# Patient Record
Sex: Male | Born: 1955 | Race: White | Hispanic: No | Marital: Married | Smoking: Never smoker
Health system: Southern US, Community
[De-identification: ages and names within clinical notes are randomized; demographics above are authoritative.]

## PROBLEM LIST (undated history)

## (undated) DIAGNOSIS — I10 Essential (primary) hypertension: Secondary | ICD-10-CM

## (undated) DIAGNOSIS — M1990 Unspecified osteoarthritis, unspecified site: Secondary | ICD-10-CM

## (undated) DIAGNOSIS — N400 Enlarged prostate without lower urinary tract symptoms: Secondary | ICD-10-CM

## (undated) DIAGNOSIS — F329 Major depressive disorder, single episode, unspecified: Secondary | ICD-10-CM

## (undated) DIAGNOSIS — K219 Gastro-esophageal reflux disease without esophagitis: Secondary | ICD-10-CM

## (undated) DIAGNOSIS — F32A Depression, unspecified: Secondary | ICD-10-CM

## (undated) DIAGNOSIS — E7800 Pure hypercholesterolemia, unspecified: Secondary | ICD-10-CM

## (undated) DIAGNOSIS — R7303 Prediabetes: Secondary | ICD-10-CM

## (undated) DIAGNOSIS — E785 Hyperlipidemia, unspecified: Secondary | ICD-10-CM

## (undated) DIAGNOSIS — I700 Atherosclerosis of aorta: Secondary | ICD-10-CM

## (undated) HISTORY — PX: APPENDECTOMY: SHX54

## (undated) HISTORY — PX: COLONOSCOPY: SHX174

## (undated) HISTORY — PX: TONSILLECTOMY: SUR1361

## (undated) HISTORY — PX: ESOPHAGOGASTRODUODENOSCOPY: SHX1529

## (undated) HISTORY — PX: KNEE ARTHROSCOPY: SHX127

---

## 2005-02-07 ENCOUNTER — Ambulatory Visit: Payer: Self-pay | Admitting: Internal Medicine

## 2005-05-15 ENCOUNTER — Other Ambulatory Visit: Payer: Self-pay

## 2005-05-22 ENCOUNTER — Ambulatory Visit: Payer: Self-pay | Admitting: Urology

## 2006-02-07 ENCOUNTER — Ambulatory Visit: Payer: Self-pay | Admitting: Gastroenterology

## 2009-07-06 ENCOUNTER — Ambulatory Visit: Payer: Self-pay | Admitting: Gastroenterology

## 2013-01-12 ENCOUNTER — Ambulatory Visit: Payer: Self-pay | Admitting: Gastroenterology

## 2013-01-13 LAB — PATHOLOGY REPORT

## 2013-01-15 ENCOUNTER — Ambulatory Visit: Payer: Self-pay | Admitting: Gastroenterology

## 2013-02-04 ENCOUNTER — Ambulatory Visit: Payer: Self-pay | Admitting: Surgery

## 2013-02-12 ENCOUNTER — Ambulatory Visit: Payer: Self-pay | Admitting: Surgery

## 2013-02-12 DIAGNOSIS — I499 Cardiac arrhythmia, unspecified: Secondary | ICD-10-CM

## 2013-02-17 LAB — PATHOLOGY REPORT

## 2013-05-04 ENCOUNTER — Telehealth: Payer: Self-pay | Admitting: Cardiology

## 2013-05-04 NOTE — Telephone Encounter (Signed)
Error - please disregard prior note - this was wrong patient.  This patient did not have a heart monitor done

## 2013-05-04 NOTE — Telephone Encounter (Signed)
Please let patient know that heart monitor showed normal rhythm with a few extra heart beats from the top of his heart which are benign

## 2014-08-19 NOTE — Op Note (Signed)
PATIENT NAME:  Nathaniel Green, Nathaniel Green MR#:  [id number] DATE OF BIRTH:  10-03-1955  DATE OF PROCEDURE:  02/12/2013  PREOPERATIVE DIAGNOSIS:  Appendiceal mass.   POSTOPERATIVE DIAGNOSIS:  Appendiceal mass.   PROCEDURE:  Laparoscopic appendectomy with cecectomy.   SURGEON:  Loreli Dollar, MD  ANESTHESIA:  General.   INDICATIONS:  This 59 year old male recently had a colonoscopy with findings of an appearance of a mass at the appendiceal orifice. CT scan appeared to demonstrate a small mass at this site. Surgery was recommended for further evaluation and treatment.   DESCRIPTION OF PROCEDURE:  The patient was placed on the operating table in the supine position under general anesthesia. The abdomen was clipped and prepared with ChloraPrep and draped in a sterile manner.   A short incision was made in the inferior aspect of the umbilicus and carried down to the deep fascia, which was grasped with laryngeal hook and elevated. A Veress needle was inserted, aspirated, and irrigated with a saline solution. Next, the peritoneal cavity was inflated with carbon dioxide. The Veress needle was removed. The 10-mm cannula was inserted. The 10-mm, 0-degree laparoscope was inserted to view the peritoneal cavity. There were no gross abnormalities seen with initial inspection of the abdomen. Another incision was made in the left lower quadrant lateral to the inferior epigastric vessels to insert a 12-mm cannula. A Babcock clamp was introduced and grasped the cecum and lifted it up for a brief inspection. The patient was placed in  Trendelenburg position and turned towards the left. Another incision was made in the right lower quadrant lateral to the inferior epigastric vessels to insert a 5-mm cannula.   Next, the Babcock clamp was used to elevate the cecum and retract it. There was a finding of some adhesions between the ileum and the pelvic sidewall. These were taken down with sharp dissection. Mobilizing the terminal  ileum and the cecum and then identified the appendix. The appendix was mobilized with blunt and sharp dissection. Its junction with the cecum was demonstrated. The junction of the terminal ileum to the cecum was demonstrated. There was fat pad, which was dissected and mobilized with the scissors and cautery. Next a window was created in the mesoappendix and the mesoappendix was divided with application of the white cartridge Endo GIA stapler and there was just a thread of remainder of the mesoappendix, which was divided with scissors and cautery. Next, some fatty tissues were dissected away from the cecum adjacent to the base of the appendix. Subsequently, using the blue cartridge Endo GIA stapler, this was advanced across a wedge of the cecum and used 4 loads of the blue cartridge Endo GIA stapler to advance across the portion of the cecum, which was resected. The staple line appeared to be hemostatic. The appendix and wedge of cecum was removed through the 12-mm port site, and also there was a separate adjacent portion of fatty tissue that was removed as well.   On a side table, I did open the cecum by excising a portion of the staple line and identified the appendiceal orifice. I did not see any tumor formation, and it appeared that there was approximately 1.5 to 2 cm of cecum surrounding the appendiceal orifice, and this was submitted in formalin for routine pathology.   Next, the operative site was further inspected. A small amount of blood was aspirated. The site was irrigated with heparinized saline solution and aspirated multiple times. Hemostasis was intact. The 12-mm incision was inspected. Hemostasis was  intact. Next, the 5-mm port was removed, and then the infraumbilical port removed. Carbon dioxide was allowed to escape from the peritoneal cavity. The skin incisions were closed with interrupted 5-0 chromic subcuticular sutures and Dermabond. The patient tolerated surgery satisfactorily and was then  prepared for transfer to the Recovery Room.  ____________________________ Lenna Sciara. Rochel Brome, MD [email or web address] D: 02/12/2013 10:53:31 ET T: 02/12/2013 11:08:29 ET JOB#: [id number]  cc: Loreli Dollar, MD, <Dictator> Loreli Dollar MD ELECTRONICALLY SIGNED 02/12/2013 12:46

## 2015-03-10 IMAGING — CT CT ABD-PELV W/ CM
1 of 3 series · 13 of 32 positions shown, 18 images · IV contrast (isovue)
Comparison: None

REASON FOR EXAM: abnormal appearing appendix seen on colonscopy
COMMENTS:

PROCEDURE:     KCT - KCT ABDOMEN/PELVIS W  - January 15, 2013 [DATE]
RESULT:     History: Abnormal appendix on colonoscopy.
TECHNIQUE: Multiple axial images of the abdomen and pelvis were performed
from the lung bases to the pubic symphysis, with p.o. contrast and with 100
ml of Isovue 370 intravenous contrast.

[Series 2: abd 3mm w 3.0 i40f 3 · axial · 0.77mm/px · z∈[-1143,-732]mm · 13 of 157 slices shown, 18 images]
[im 10/157  soft-tissue]
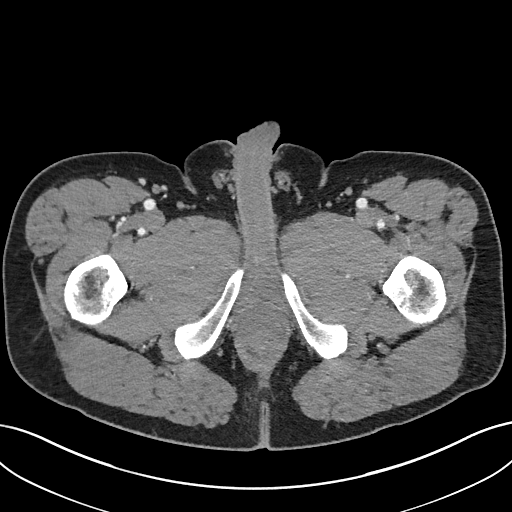
[im 10/157  bone]
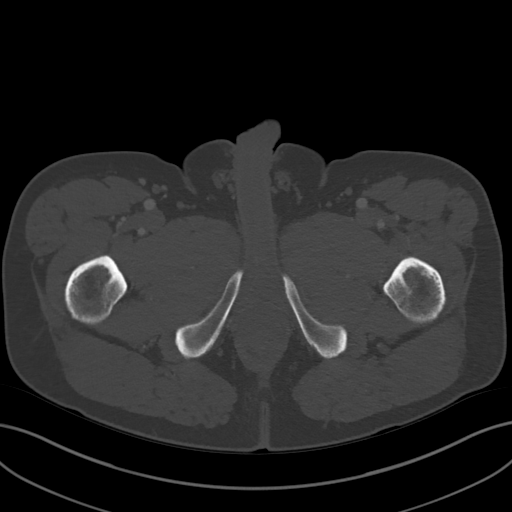
[im 28/157  soft-tissue]
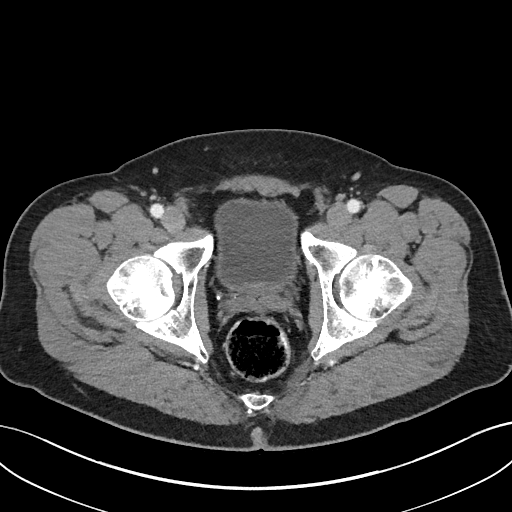
[im 37/157  soft-tissue]
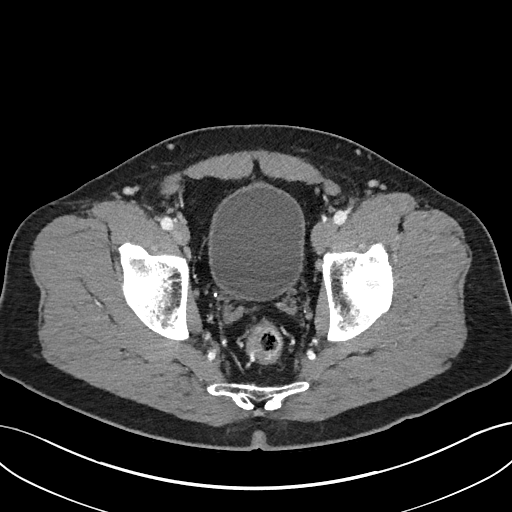
[im 46/157  soft-tissue]
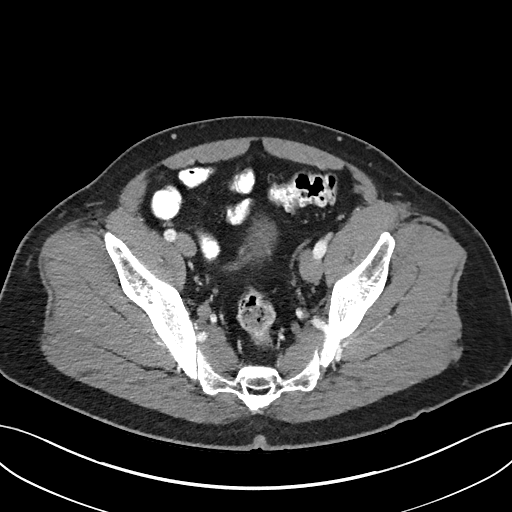
[im 65/157  soft-tissue]
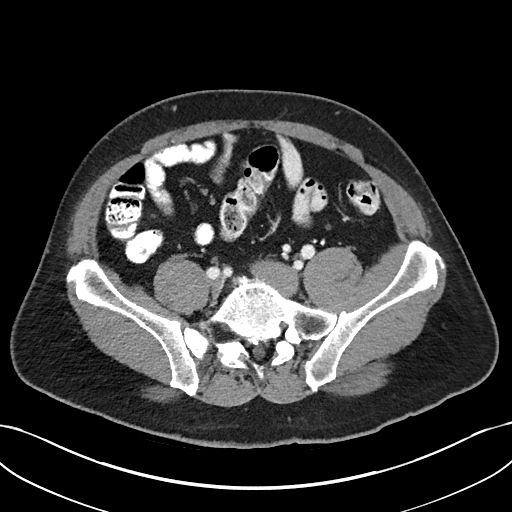
[im 74/157  soft-tissue]
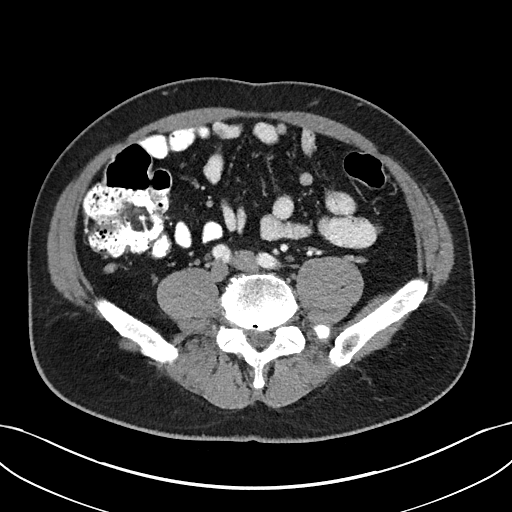
[im 83/157  soft-tissue]
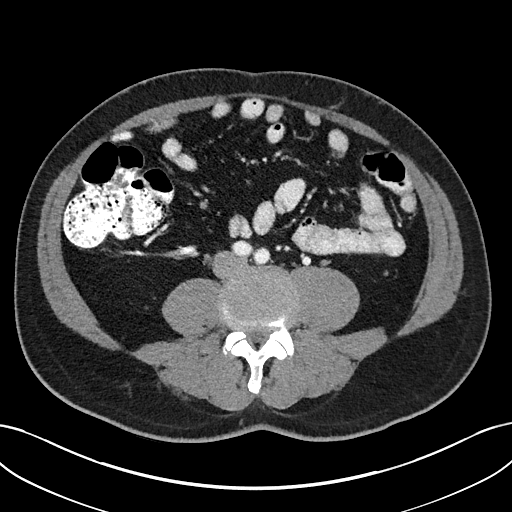
[im 101/157  soft-tissue]
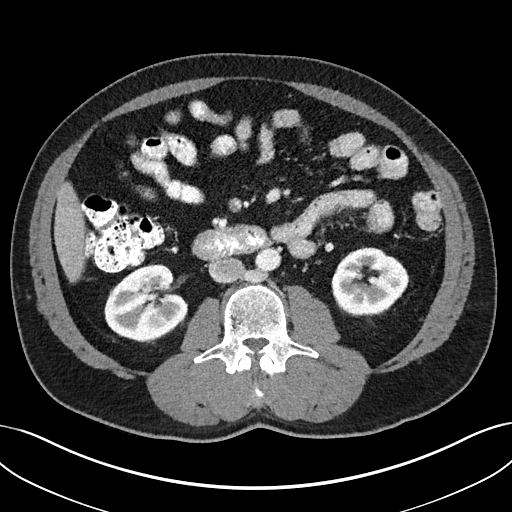
[im 111/157  soft-tissue]
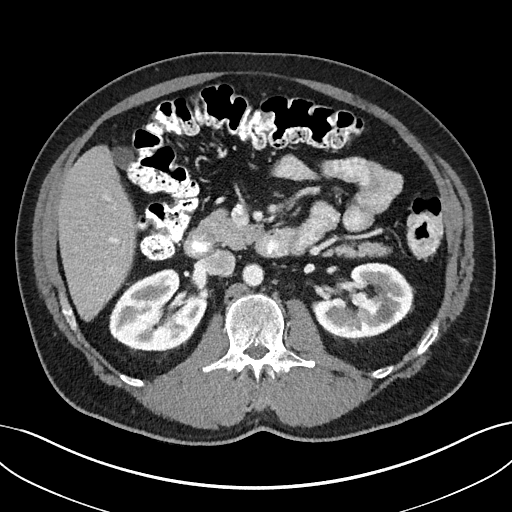
[im 111/157  bone]
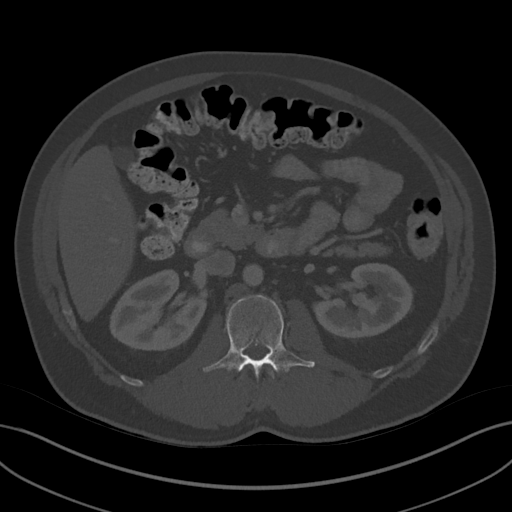
[im 120/157  soft-tissue]
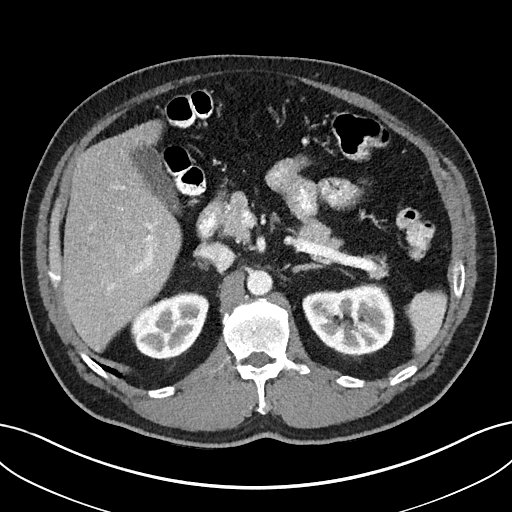
[im 120/157  lung]
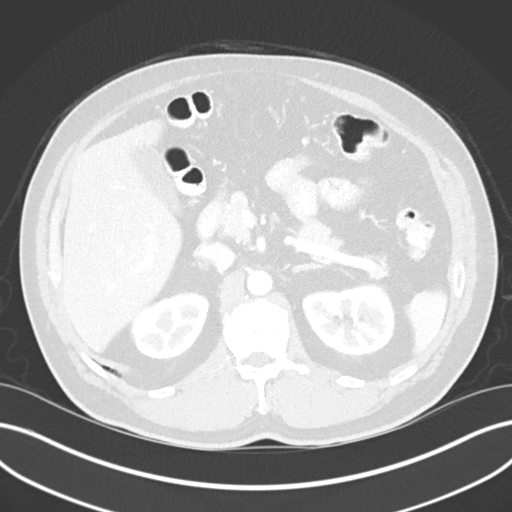
[im 129/157  lung]
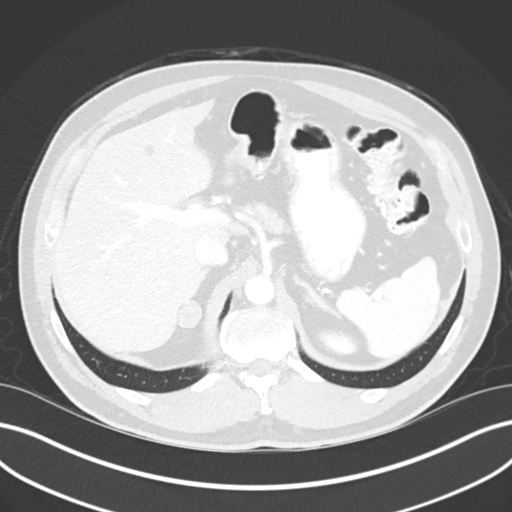
[im 138/157  soft-tissue]
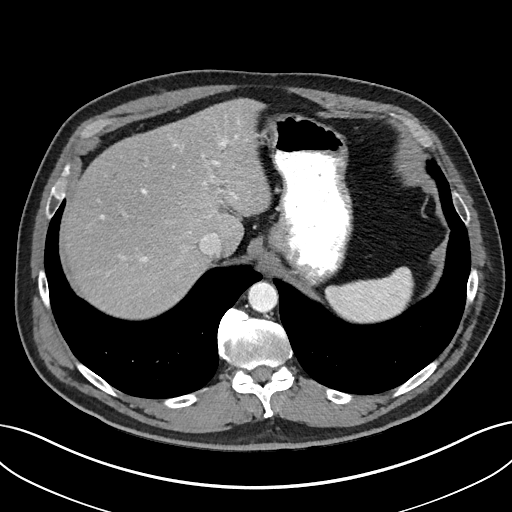
[im 138/157  lung]
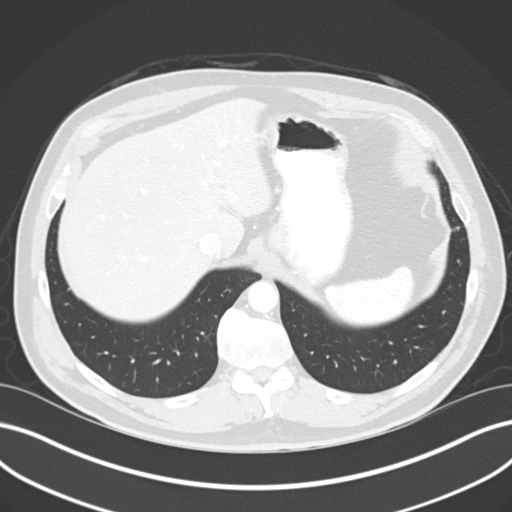
[im 147/157  soft-tissue]
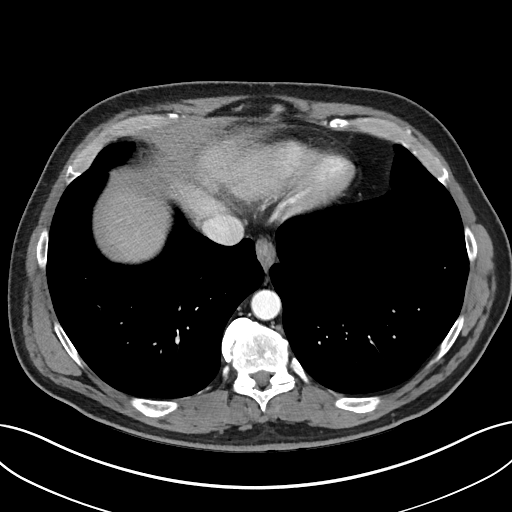
[im 147/157  lung]
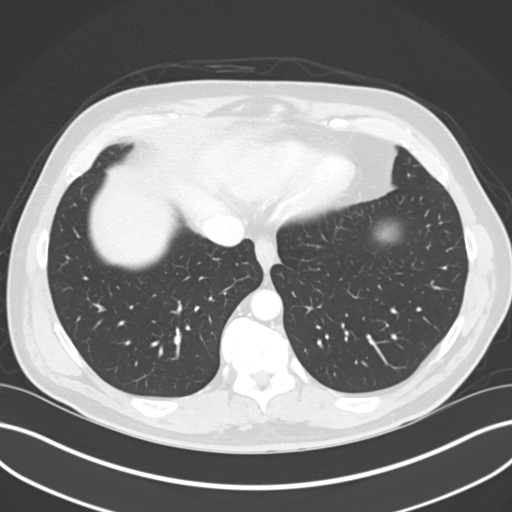

[13 of 32 positions shown; findings below may reference images not displayed]

FINDINGS: The lung bases are clear. There is no pneumothorax. The heart size is
normal.

There are a few subcentimeter hypodensities scattered within the liver
likely representing small cysts or hamartomas. There is no intrahepatic or
extrahepatic biliary ductal dilatation. The gallbladder is unremarkable. The
spleen demonstrates no focal abnormality. There is an indeterminate 2.4 cm
right adrenal mass. There is a hypodense, fluid attenuating 2.5 cm left
renal mass most consistent with a cyst. The right kidney, left adrenal gland
and pancreas are normal. The bladder is unremarkable.

The stomach, duodenum, small intestine, and large intestine demonstrate no
contrast extravasation or dilatation. The appendix is normal in caliber
without periappendiceal inflammatory changes. There is soft tissue
prominence at the base of the appendix within the cecum measuring
approximately 1.4 cm which may represent fecal material versus a possible
small soft tissue mass. There is no pneumoperitoneum, pneumatosis, or portal
venous gas. There is no abdominal or pelvic free fluid. There is no
lymphadenopathy.

The abdominal aorta is normal in caliber.

The osseous structures are unremarkable.
IMPRESSION: 1. There is soft tissue prominence at the base of the appendix within the
cecum measuring approximately 1.4 cm which may represent fecal material
versus a possible small soft tissue mass. Recommend correlation with recent
colonoscopy and any possible biopsy performed at that time.

2. There is an indeterminate 2.4 cm right adrenal mass. Recommend followup
evaluation with a MRI of the abdomen without and with intravenous contrast
for further characterization.

[REDACTED]

## 2015-05-31 DIAGNOSIS — L578 Other skin changes due to chronic exposure to nonionizing radiation: Secondary | ICD-10-CM | POA: Diagnosis not present

## 2015-05-31 DIAGNOSIS — B351 Tinea unguium: Secondary | ICD-10-CM | POA: Diagnosis not present

## 2015-05-31 DIAGNOSIS — D229 Melanocytic nevi, unspecified: Secondary | ICD-10-CM | POA: Diagnosis not present

## 2015-05-31 DIAGNOSIS — B353 Tinea pedis: Secondary | ICD-10-CM | POA: Diagnosis not present

## 2015-05-31 DIAGNOSIS — Z1283 Encounter for screening for malignant neoplasm of skin: Secondary | ICD-10-CM | POA: Diagnosis not present

## 2015-05-31 DIAGNOSIS — L812 Freckles: Secondary | ICD-10-CM | POA: Diagnosis not present

## 2015-05-31 DIAGNOSIS — D1800 Hemangioma unspecified site: Secondary | ICD-10-CM | POA: Diagnosis not present

## 2015-05-31 DIAGNOSIS — L821 Other seborrheic keratosis: Secondary | ICD-10-CM | POA: Diagnosis not present

## 2015-05-31 DIAGNOSIS — L739 Follicular disorder, unspecified: Secondary | ICD-10-CM | POA: Diagnosis not present

## 2015-11-08 DIAGNOSIS — H524 Presbyopia: Secondary | ICD-10-CM | POA: Diagnosis not present

## 2015-12-13 DIAGNOSIS — R6882 Decreased libido: Secondary | ICD-10-CM | POA: Diagnosis not present

## 2015-12-13 DIAGNOSIS — Z125 Encounter for screening for malignant neoplasm of prostate: Secondary | ICD-10-CM | POA: Diagnosis not present

## 2015-12-13 DIAGNOSIS — Z114 Encounter for screening for human immunodeficiency virus [HIV]: Secondary | ICD-10-CM | POA: Diagnosis not present

## 2015-12-13 DIAGNOSIS — Z0000 Encounter for general adult medical examination without abnormal findings: Secondary | ICD-10-CM | POA: Diagnosis not present

## 2015-12-13 DIAGNOSIS — I10 Essential (primary) hypertension: Secondary | ICD-10-CM | POA: Diagnosis not present

## 2015-12-13 DIAGNOSIS — M1710 Unilateral primary osteoarthritis, unspecified knee: Secondary | ICD-10-CM | POA: Diagnosis not present

## 2015-12-20 DIAGNOSIS — M1710 Unilateral primary osteoarthritis, unspecified knee: Secondary | ICD-10-CM | POA: Diagnosis not present

## 2015-12-20 DIAGNOSIS — E784 Other hyperlipidemia: Secondary | ICD-10-CM | POA: Diagnosis not present

## 2015-12-20 DIAGNOSIS — I10 Essential (primary) hypertension: Secondary | ICD-10-CM | POA: Diagnosis not present

## 2015-12-20 DIAGNOSIS — Z0001 Encounter for general adult medical examination with abnormal findings: Secondary | ICD-10-CM | POA: Diagnosis not present

## 2016-09-24 DIAGNOSIS — M25561 Pain in right knee: Secondary | ICD-10-CM | POA: Diagnosis not present

## 2016-09-24 DIAGNOSIS — M25562 Pain in left knee: Secondary | ICD-10-CM | POA: Diagnosis not present

## 2016-10-03 DIAGNOSIS — M25561 Pain in right knee: Secondary | ICD-10-CM | POA: Diagnosis not present

## 2016-10-04 DIAGNOSIS — M25561 Pain in right knee: Secondary | ICD-10-CM | POA: Diagnosis not present

## 2016-10-07 NOTE — H&P (Signed)
This is a pleasant 61 year old gentleman who presents to our clinic today with bilateral knee pain, right greater than left.  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  He is hanging in there and doing okay, but has had recent twisting event to the right knee while playing tennis.  Since then he has had sharp shooting pain and slight swelling in the medial aspect.  He has tried anti-inflammatories with no relief of symptoms.  He is not having mechanical symptoms.    Past Medical, Family and Social History reviewed in detail on patient questionnaire and signed.   Review of systems as detailed on HPI; all others reviewed and are negative.    EXAMINATION: Well-developed, well-nourished gentleman in no acute distress.  Alert and oriented x 3.  Examination of the left knee reveals valgus deformity.  Range of motion to 120 degrees.  Right knee range of motion is from 0 to 120 degrees.  Marked pain medial joint line over the medial meniscus.  He is neurovascularly intact distally.    X-RAYS: X-rays of the left knee reveal bone-on-bone in all three compartments.  Right knee with moderate tri-compartmental changes.    IMPRESSION: 1. 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  2. Right knee acute traumatic medial meniscus tear.    DISPOSITION: In regards to the left knee we are happy to inject this if he continues to have pain.  At the end of the day this will come down to a total knee replacement.  In regards to his right knee we are going to get an MRI to assess his medial meniscus.  Follow up with [place] once this is complete.    Addendum: MRI right knee shows a large medial meniscus tear.  Mahonri would like to proceed with right knee arthroscopic debridement of this meniscus tear.  Risks, benefits and possible complications reviewed.  Rehab and recovery time discussed.  All questions were answered.  Paperwork  complete.

## 2016-10-11 ENCOUNTER — Encounter (HOSPITAL_BASED_OUTPATIENT_CLINIC_OR_DEPARTMENT_OTHER): Payer: Self-pay | Admitting: *Deleted

## 2016-10-16 ENCOUNTER — Encounter (HOSPITAL_BASED_OUTPATIENT_CLINIC_OR_DEPARTMENT_OTHER)
Admission: RE | Admit: 2016-10-16 | Discharge: 2016-10-16 | Disposition: A | Discharge status: Home / Self Care | Payer: 59 | Source: Ambulatory Visit | Attending: Orthopedic Surgery | Admitting: Orthopedic Surgery

## 2016-10-16 DIAGNOSIS — Y929 Unspecified place or not applicable: Secondary | ICD-10-CM | POA: Diagnosis not present

## 2016-10-16 DIAGNOSIS — S83281A Other tear of lateral meniscus, current injury, right knee, initial encounter: Secondary | ICD-10-CM | POA: Diagnosis not present

## 2016-10-16 DIAGNOSIS — S83241A Other tear of medial meniscus, current injury, right knee, initial encounter: Secondary | ICD-10-CM | POA: Diagnosis not present

## 2016-10-16 DIAGNOSIS — M1711 Unilateral primary osteoarthritis, right knee: Secondary | ICD-10-CM | POA: Diagnosis not present

## 2016-10-16 DIAGNOSIS — I10 Essential (primary) hypertension: Secondary | ICD-10-CM | POA: Diagnosis not present

## 2016-10-16 DIAGNOSIS — Y9369 Activity, other involving other sports and athletics played as a team or group: Secondary | ICD-10-CM | POA: Diagnosis not present

## 2016-10-17 ENCOUNTER — Encounter (HOSPITAL_BASED_OUTPATIENT_CLINIC_OR_DEPARTMENT_OTHER): Payer: Self-pay | Admitting: Anesthesiology

## 2016-10-17 ENCOUNTER — Encounter (HOSPITAL_BASED_OUTPATIENT_CLINIC_OR_DEPARTMENT_OTHER)
Admission: RE | Disposition: A | Discharge status: Home / Self Care | Payer: Self-pay | Source: Ambulatory Visit | Attending: Orthopedic Surgery

## 2016-10-17 ENCOUNTER — Ambulatory Visit (HOSPITAL_BASED_OUTPATIENT_CLINIC_OR_DEPARTMENT_OTHER): Payer: 59 | Admitting: Anesthesiology

## 2016-10-17 ENCOUNTER — Ambulatory Visit (HOSPITAL_BASED_OUTPATIENT_CLINIC_OR_DEPARTMENT_OTHER)
Admission: RE | Admit: 2016-10-17 | Discharge: 2016-10-17 | Disposition: A | Discharge status: Home / Self Care | Payer: 59 | Source: Ambulatory Visit | Attending: Orthopedic Surgery | Admitting: Orthopedic Surgery

## 2016-10-17 DIAGNOSIS — S83241A Other tear of medial meniscus, current injury, right knee, initial encounter: Secondary | ICD-10-CM | POA: Insufficient documentation

## 2016-10-17 DIAGNOSIS — S60212A Contusion of left wrist, initial encounter: Secondary | ICD-10-CM | POA: Diagnosis not present

## 2016-10-17 DIAGNOSIS — I10 Essential (primary) hypertension: Secondary | ICD-10-CM | POA: Diagnosis not present

## 2016-10-17 DIAGNOSIS — S6992XA Unspecified injury of left wrist, hand and finger(s), initial encounter: Secondary | ICD-10-CM | POA: Diagnosis not present

## 2016-10-17 DIAGNOSIS — S83281A Other tear of lateral meniscus, current injury, right knee, initial encounter: Secondary | ICD-10-CM | POA: Diagnosis not present

## 2016-10-17 DIAGNOSIS — M1711 Unilateral primary osteoarthritis, right knee: Secondary | ICD-10-CM | POA: Diagnosis not present

## 2016-10-17 DIAGNOSIS — M2241 Chondromalacia patellae, right knee: Secondary | ICD-10-CM | POA: Diagnosis not present

## 2016-10-17 DIAGNOSIS — Y9369 Activity, other involving other sports and athletics played as a team or group: Secondary | ICD-10-CM | POA: Insufficient documentation

## 2016-10-17 DIAGNOSIS — Y929 Unspecified place or not applicable: Secondary | ICD-10-CM | POA: Insufficient documentation

## 2016-10-17 HISTORY — PX: KNEE ARTHROSCOPY WITH MEDIAL MENISECTOMY: SHX5651

## 2016-10-17 HISTORY — DX: Pure hypercholesterolemia, unspecified: E78.00

## 2016-10-17 HISTORY — PX: KNEE ARTHROSCOPY WITH LATERAL MENISECTOMY: SHX6193

## 2016-10-17 HISTORY — DX: Depression, unspecified: F32.A

## 2016-10-17 HISTORY — PX: CHONDROPLASTY: SHX5177

## 2016-10-17 HISTORY — DX: Essential (primary) hypertension: I10

## 2016-10-17 HISTORY — DX: Major depressive disorder, single episode, unspecified: F32.9

## 2016-10-17 SURGERY — ARTHROSCOPY, KNEE, WITH MEDIAL MENISCECTOMY
Anesthesia: General | Site: Knee | Laterality: Right

## 2016-10-17 MED ORDER — LABETALOL HCL 5 MG/ML IV SOLN
INTRAVENOUS | Status: DC | PRN
  Administered 2016-10-17: 5 mg via INTRAVENOUS

## 2016-10-17 MED ORDER — CHLORHEXIDINE GLUCONATE 4 % EX LIQD: 60.0000 mL | Freq: Once | CUTANEOUS | Status: DC

## 2016-10-17 MED ORDER — LIDOCAINE 2% (20 MG/ML) 5 ML SYRINGE
INTRAMUSCULAR | Status: AC
  Filled 2016-10-17: qty 5

## 2016-10-17 MED ORDER — FENTANYL CITRATE (PF) 100 MCG/2ML IJ SOLN
50.0000 ug | INTRAMUSCULAR | Status: AC | PRN
  Administered 2016-10-17 (×3): 50 ug via INTRAVENOUS

## 2016-10-17 MED ORDER — LACTATED RINGERS IV SOLN: INTRAVENOUS | Status: DC

## 2016-10-17 MED ORDER — CEFAZOLIN SODIUM-DEXTROSE 2-4 GM/100ML-% IV SOLN
2.0000 g | INTRAVENOUS | Status: AC
  Administered 2016-10-17: 2 g via INTRAVENOUS

## 2016-10-17 MED ORDER — KETOROLAC TROMETHAMINE 30 MG/ML IJ SOLN
INTRAMUSCULAR | Status: DC | PRN
  Administered 2016-10-17: 15 mg via INTRAVENOUS

## 2016-10-17 MED ORDER — MIDAZOLAM HCL 2 MG/2ML IJ SOLN
INTRAMUSCULAR | Status: AC
  Filled 2016-10-17: qty 2

## 2016-10-17 MED ORDER — HYDROCODONE-ACETAMINOPHEN 7.5-325 MG PO TABS: 1.0000 | ORAL_TABLET | Freq: Once | ORAL | Status: DC | PRN

## 2016-10-17 MED ORDER — LACTATED RINGERS IV SOLN
INTRAVENOUS | Status: DC
  Administered 2016-10-17: 11:00:00 via INTRAVENOUS

## 2016-10-17 MED ORDER — PROPOFOL 10 MG/ML IV BOLUS
INTRAVENOUS | Status: DC | PRN
  Administered 2016-10-17: 200 mg via INTRAVENOUS

## 2016-10-17 MED ORDER — DEXAMETHASONE SODIUM PHOSPHATE 10 MG/ML IJ SOLN
INTRAMUSCULAR | Status: AC
  Filled 2016-10-17: qty 1

## 2016-10-17 MED ORDER — FENTANYL CITRATE (PF) 100 MCG/2ML IJ SOLN
INTRAMUSCULAR | Status: AC
  Filled 2016-10-17: qty 2

## 2016-10-17 MED ORDER — ONDANSETRON HCL 4 MG/2ML IJ SOLN
INTRAMUSCULAR | Status: DC | PRN
  Administered 2016-10-17: 4 mg via INTRAVENOUS

## 2016-10-17 MED ORDER — SCOPOLAMINE 1 MG/3DAYS TD PT72: 1.0000 | MEDICATED_PATCH | Freq: Once | TRANSDERMAL | Status: DC | PRN

## 2016-10-17 MED ORDER — ONDANSETRON HCL 4 MG/2ML IJ SOLN
INTRAMUSCULAR | Status: AC
  Filled 2016-10-17: qty 2

## 2016-10-17 MED ORDER — DEXAMETHASONE SODIUM PHOSPHATE 4 MG/ML IJ SOLN
INTRAMUSCULAR | Status: DC | PRN
  Administered 2016-10-17: 10 mg via INTRAVENOUS

## 2016-10-17 MED ORDER — CEFAZOLIN SODIUM-DEXTROSE 2-4 GM/100ML-% IV SOLN
INTRAVENOUS | Status: AC
  Filled 2016-10-17: qty 100

## 2016-10-17 MED ORDER — LABETALOL HCL 5 MG/ML IV SOLN
INTRAVENOUS | Status: AC
  Filled 2016-10-17: qty 4

## 2016-10-17 MED ORDER — BUPIVACAINE HCL (PF) 0.5 % IJ SOLN
INTRAMUSCULAR | Status: DC | PRN
  Administered 2016-10-17: 20 mL

## 2016-10-17 MED ORDER — HYDROMORPHONE HCL 1 MG/ML IJ SOLN
0.2500 mg | INTRAMUSCULAR | Status: DC | PRN
  Administered 2016-10-17: 0.25 mg via INTRAVENOUS

## 2016-10-17 MED ORDER — KETOROLAC TROMETHAMINE 30 MG/ML IJ SOLN
INTRAMUSCULAR | Status: AC
  Filled 2016-10-17: qty 1

## 2016-10-17 MED ORDER — OXYCODONE-ACETAMINOPHEN 5-325 MG PO TABS: 1.0000 | ORAL_TABLET | ORAL | 0 refills | Status: AC | PRN

## 2016-10-17 MED ORDER — PROMETHAZINE HCL 25 MG/ML IJ SOLN: 6.2500 mg | INTRAMUSCULAR | Status: DC | PRN

## 2016-10-17 MED ORDER — ONDANSETRON HCL 4 MG PO TABS: 4.0000 mg | ORAL_TABLET | Freq: Three times a day (TID) | ORAL | 0 refills | Status: AC | PRN

## 2016-10-17 MED ORDER — METHYLPREDNISOLONE ACETATE 80 MG/ML IJ SUSP
INTRAMUSCULAR | Status: DC | PRN
  Administered 2016-10-17: 80 mg

## 2016-10-17 MED ORDER — METHYLPREDNISOLONE ACETATE 80 MG/ML IJ SUSP
INTRAMUSCULAR | Status: AC
  Filled 2016-10-17: qty 1

## 2016-10-17 MED ORDER — HYDROMORPHONE HCL 1 MG/ML IJ SOLN
INTRAMUSCULAR | Status: AC
  Filled 2016-10-17: qty 0.5

## 2016-10-17 MED ORDER — LIDOCAINE HCL (CARDIAC) 20 MG/ML IV SOLN
INTRAVENOUS | Status: DC | PRN
  Administered 2016-10-17: 60 mg via INTRAVENOUS

## 2016-10-17 MED ORDER — BUPIVACAINE HCL (PF) 0.5 % IJ SOLN
INTRAMUSCULAR | Status: AC
  Filled 2016-10-17: qty 30

## 2016-10-17 MED ORDER — SODIUM CHLORIDE 0.9 % IR SOLN
Status: DC | PRN
  Administered 2016-10-17: 6000 mL

## 2016-10-17 MED ORDER — MIDAZOLAM HCL 2 MG/2ML IJ SOLN
1.0000 mg | INTRAMUSCULAR | Status: DC | PRN
Start: 2016-10-17 — End: 2016-10-17
  Administered 2016-10-17: 2 mg via INTRAVENOUS

## 2016-10-17 SURGICAL SUPPLY — 41 items
BANDAGE ACE 6X5 VEL STRL LF (GAUZE/BANDAGES/DRESSINGS) ×4 IMPLANT
BLADE CUDA 5.5 (BLADE) IMPLANT
BLADE CUDA GRT WHITE 3.5 (BLADE) IMPLANT
BLADE CUTTER GATOR 3.5 (BLADE) ×4 IMPLANT
BLADE CUTTER MENIS 5.5 (BLADE) IMPLANT
BLADE GREAT WHITE 4.2 (BLADE) ×3 IMPLANT
BLADE GREAT WHITE 4.2MM (BLADE) ×1
BUR OVAL 4.0 (BURR) IMPLANT
CUTTER MENISCUS  4.2MM (BLADE)
CUTTER MENISCUS 4.2MM (BLADE) IMPLANT
DRAPE ARTHROSCOPY W/POUCH 90 (DRAPES) ×4 IMPLANT
DURAPREP 26ML APPLICATOR (WOUND CARE) ×4 IMPLANT
ELECT MENISCUS 165MM 90D (ELECTRODE) IMPLANT
ELECT REM PT RETURN 9FT ADLT (ELECTROSURGICAL)
ELECTRODE REM PT RTRN 9FT ADLT (ELECTROSURGICAL) IMPLANT
GAUZE SPONGE 4X4 12PLY STRL (GAUZE/BANDAGES/DRESSINGS) ×4 IMPLANT
GAUZE XEROFORM 1X8 LF (GAUZE/BANDAGES/DRESSINGS) ×4 IMPLANT
GLOVE BIO SURGEON STRL SZ7 (GLOVE) ×4 IMPLANT
GLOVE BIOGEL PI IND STRL 7.0 (GLOVE) ×2 IMPLANT
GLOVE BIOGEL PI IND STRL 7.5 (GLOVE) ×2 IMPLANT
GLOVE BIOGEL PI INDICATOR 7.0 (GLOVE) ×2
GLOVE BIOGEL PI INDICATOR 7.5 (GLOVE) ×2
GLOVE ECLIPSE 7.0 STRL STRAW (GLOVE) ×4 IMPLANT
GLOVE SURG ORTHO 8.0 STRL STRW (GLOVE) ×4 IMPLANT
GOWN STRL REUS W/ TWL LRG LVL3 (GOWN DISPOSABLE) ×2 IMPLANT
GOWN STRL REUS W/ TWL XL LVL3 (GOWN DISPOSABLE) ×4 IMPLANT
GOWN STRL REUS W/TWL LRG LVL3 (GOWN DISPOSABLE) ×2
GOWN STRL REUS W/TWL XL LVL3 (GOWN DISPOSABLE) ×4
HOLDER KNEE FOAM BLUE (MISCELLANEOUS) ×4 IMPLANT
IV NS IRRIG 3000ML ARTHROMATIC (IV SOLUTION) ×8 IMPLANT
KNEE WRAP E Z 3 GEL PACK (MISCELLANEOUS) ×4 IMPLANT
MANIFOLD NEPTUNE II (INSTRUMENTS) ×4 IMPLANT
PACK ARTHROSCOPY DSU (CUSTOM PROCEDURE TRAY) ×4 IMPLANT
PACK BASIN DAY SURGERY FS (CUSTOM PROCEDURE TRAY) ×4 IMPLANT
PENCIL BUTTON HOLSTER BLD 10FT (ELECTRODE) IMPLANT
SET ARTHROSCOPY TUBING (MISCELLANEOUS) ×2
SET ARTHROSCOPY TUBING LN (MISCELLANEOUS) ×2 IMPLANT
SUT ETHILON 3 0 PS 1 (SUTURE) ×4 IMPLANT
SUT VIC AB 3-0 FS2 27 (SUTURE) IMPLANT
TOWEL OR 17X24 6PK STRL BLUE (TOWEL DISPOSABLE) ×4 IMPLANT
WATER STERILE IRR 1000ML POUR (IV SOLUTION) ×4 IMPLANT

## 2016-10-17 NOTE — Anesthesia Preprocedure Evaluation (Addendum)
Anesthesia Evaluation  Patient identified by MRN, date of birth, ID band Patient awake    Reviewed: Allergy & Precautions, NPO status , Patient's Chart, lab work & pertinent test results  Airway Mallampati: II  TM Distance: >3 FB Neck ROM: Full    Dental  (+) Dental Advisory Given   Pulmonary neg pulmonary ROS,    breath sounds clear to auscultation       Cardiovascular hypertension, Pt. on medications  Rhythm:Regular Rate:Normal     Neuro/Psych negative neurological ROS     GI/Hepatic negative GI ROS, Neg liver ROS,   Endo/Other  negative endocrine ROS  Renal/GU negative Renal ROS     Musculoskeletal   Abdominal   Peds  Hematology negative hematology ROS (+)   Anesthesia Other Findings   Reproductive/Obstetrics                            Anesthesia Physical Anesthesia Plan  ASA: II  Anesthesia Plan: General   Post-op Pain Management:    Induction: Intravenous  PONV Risk Score and Plan: 2 and Ondansetron and Dexamethasone  Airway Management Planned: LMA  Additional Equipment:   Intra-op Plan:   Post-operative Plan: Extubation in OR  Informed Consent: I have reviewed the patients History and Physical, chart, labs and discussed the procedure including the risks, benefits and alternatives for the proposed anesthesia with the patient or authorized representative who has indicated his/her understanding and acceptance.   Dental advisory given  Plan Discussed with: CRNA  Anesthesia Plan Comments:        Anesthesia Quick Evaluation

## 2016-10-17 NOTE — Discharge Instructions (Signed)
Discharge Instructions after Knee Arthroscopy ° ° °You will have a light dressing on your knee.  °Leave the dressing in place until the third day after your surgery and then remove it and place a band-aid over the stitches.  °After the bandage has been removed you may shower, but do not soak the incision. °You may begin gentle motion of your leg immediately after surgery. °Pump your foot up and down 20 times per hour, every hour you are awake.  °Apply ice to the knee 3 times per day for 30 minutes for the first 1 week until your knee is feeling comfortable again. Do not use heat.  °You may begin straight leg raising exercises (if you have a brace with it on). While lying down, pull your foot all the way up, tighten your quadriceps muscle and lift your heel off of the ground. Hold this position for 2 seconds, and then let the leg back down. Repeat the exercise 10 times, at least 3 times a day.  °Pain medicine has been prescribed for you.  °Use your medicine as needed over the first 48 hours, and then you can begin to taper your use. You may take Extra Strength Tylenol or Tylenol only in place of the pain pills.  ° ° °Please call [phone] for any problems. Including the following: ° °- excessive redness of the incisions °- drainage for more than 4 days °- fever of more than 101.5 F ° °*Please note that pain medications will not be refilled after hours or on weekends. ° ° ° ° ° °Post Anesthesia Home Care Instructions ° °Activity: °Get plenty of rest for the remainder of the day. A responsible individual must stay with you for 24 hours following the procedure.  °For the next 24 hours, DO NOT: °-Drive a car °-Operate machinery °-Drink alcoholic beverages °-Take any medication unless instructed by your physician °-Make any legal decisions or sign important papers. ° °Meals: °Start with liquid foods such as gelatin or soup. Progress to regular foods as tolerated. Avoid greasy, spicy, heavy foods. If nausea and/or  vomiting occur, drink only clear liquids until the nausea and/or vomiting subsides. Call your physician if vomiting continues. ° °Special Instructions/Symptoms: °Your throat may feel dry or sore from the anesthesia or the breathing tube placed in your throat during surgery. If this causes discomfort, gargle with warm salt water. The discomfort should disappear within 24 hours. ° °If you had a scopolamine patch placed behind your ear for the management of post- operative nausea and/or vomiting: ° °1. The medication in the patch is effective for 72 hours, after which it should be removed.  Wrap patch in a tissue and discard in the trash. Wash hands thoroughly with soap and water. °2. You may remove the patch earlier than 72 hours if you experience unpleasant side effects which may include dry mouth, dizziness or visual disturbances. °3. Avoid touching the patch. Wash your hands with soap and water after contact with the patch. °  ° ° °

## 2016-10-17 NOTE — Interval H&P Note (Signed)
History and Physical Interval Note:  10/17/2016 9:44 AM  Nathaniel Green  has presented today for surgery, with the diagnosis of RIGHT KNEE CHONDROMALACIA PATELLAE, MEDIAL MENISCUS TEAR, LATERAL MENISCUS TEAR,   The various methods of treatment have been discussed with the patient and family. After consideration of risks, benefits and other options for treatment, the patient has consented to  Procedure(s): RIGHT KNEE ARTHROSCOPY WITH MEDIAL AND LATERAL MENISECTOMY, CHONDROPLASTY (Right) as a surgical intervention .  The patient's history has been reviewed, patient examined, no change in status, stable for surgery.  I have reviewed the patient's chart and labs.  Questions were answered to the patient's satisfaction.     Ninetta Lights

## 2016-10-17 NOTE — Transfer of Care (Signed)
Immediate Anesthesia Transfer of Care Note  Patient: Nathaniel Green  Procedure(s) Performed: Procedure(s): RIGHT KNEE ARTHROSCOPY WITH MEDIAL AND LATERAL MENISECTOMY, CHONDROPLASTY (Right)  Patient Location: PACU  Anesthesia Type:General  Level of Consciousness: awake and sedated  Airway & Oxygen Therapy: Patient Spontanous Breathing and Patient connected to face mask oxygen  Post-op Assessment: Report given to RN and Post -op Vital signs reviewed and stable  Post vital signs: Reviewed and stable  Last Vitals:  Vitals:   10/17/16 0929 10/17/16 1155  BP: (!) 144/91 105/73  Pulse: 68 61  Resp: 17   Temp: 36.7 C     Last Pain:  Vitals:   10/17/16 0936  PainSc: 0-No pain      Patients Stated Pain Goal: 4 (93/71/69 6789)  Complications: No apparent anesthesia complications

## 2016-10-17 NOTE — Anesthesia Procedure Notes (Signed)
Performed by: Terrance Mass

## 2016-10-17 NOTE — Anesthesia Postprocedure Evaluation (Signed)
Anesthesia Post Note  Patient: Lizzie Cokley  Procedure(s) Performed: Procedure(s) (LRB): RIGHT KNEE ARTHROSCOPY WITH MEDIAL AND LATERAL MENISECTOMY, CHONDROPLASTY (Right)     Patient location during evaluation: PACU Anesthesia Type: General Level of consciousness: awake and alert Pain management: pain level controlled Vital Signs Assessment: post-procedure vital signs reviewed and stable Respiratory status: spontaneous breathing, nonlabored ventilation, respiratory function stable and patient connected to nasal cannula oxygen Cardiovascular status: blood pressure returned to baseline and stable Postop Assessment: no signs of nausea or vomiting Anesthetic complications: no    Last Vitals:  Vitals:   10/17/16 1230 10/17/16 1239  BP: (!) 141/96 (!) 144/93  Pulse: (!) 58 61  Resp: 11 16  Temp:  36.4 C    Last Pain:  Vitals:   10/17/16 1239  PainSc: 0-No pain                 Tiajuana Amass

## 2016-10-17 NOTE — Anesthesia Procedure Notes (Signed)
Procedure Name: LMA Insertion Performed by: Terrance Mass Pre-anesthesia Checklist: Patient identified, Emergency Drugs available, Suction available and Patient being monitored Patient Re-evaluated:Patient Re-evaluated prior to inductionOxygen Delivery Method: Circle system utilized Preoxygenation: Pre-oxygenation with 100% oxygen Intubation Type: IV induction Ventilation: Mask ventilation without difficulty LMA: LMA inserted LMA Size: 5.0 Number of attempts: 1 Placement Confirmation: positive ETCO2 Tube secured with: Tape Dental Injury: Teeth and Oropharynx as per pre-operative assessment

## 2016-10-18 ENCOUNTER — Encounter (HOSPITAL_BASED_OUTPATIENT_CLINIC_OR_DEPARTMENT_OTHER): Payer: Self-pay | Admitting: Orthopedic Surgery

## 2016-10-18 NOTE — Op Note (Signed)
NAMETROYE, HIEMSTRA             ACCOUNT NO.:  [id number]  MEDICAL RECORD NO.:  [id number]  LOCATION:                                 FACILITY:  PHYSICIAN:  Ninetta Lights, M.D. DATE OF BIRTH:  1955-05-30  DATE OF PROCEDURE:  10/17/2016 DATE OF DISCHARGE:                              OPERATIVE REPORT   PREOPERATIVE DIAGNOSIS:  Right knee tricompartmental mild-to-moderate degenerative arthritis.  Medial and lateral meniscus tears.  POSTOPERATIVE DIAGNOSIS:  Right knee tricompartmental mild-to-moderate degenerative arthritis.  Medial and lateral meniscus tears.  PROCEDURES:  Right knee exam under anesthesia and arthroscopy.  Partial medial and lateral meniscectomy.  Tricompartmental chondroplasty.  SURGEON:  Ninetta Lights, M.D.  ASSISTANT:  Elmyra Ricks, PA.  ANESTHESIA:  General.  BLOOD LOSS:  Minimal.  SPECIMENS:  None.  CULTURES:  None.  COMPLICATIONS:  None.  DRESSINGS:  Soft compressive.  TOURNIQUET:  Not employed.  DESCRIPTION OF PROCEDURE:  The patient was brought to the operating room and after adequate anesthesia had been obtained, leg holder applied. Leg prepped and draped in usual sterile fashion.  Two portals; one each medial and lateral parapatellar.  Arthroscope was introduced.  Knee distended and inspected.  Grade 2, mild grade 3 changes.  Inferior patella debrided.  Lateral chondral debris debrided.  Grade 2 and 3 medial femoral condyle and lateral tibial plateau, all debrided. Complex tearing of middle third lateral meniscus, saucerized out, tapered in smoothly.  Medial meniscus, extensive complex tearing of the entire posterior half with a fragment flipped down into the gutter.  All that removed to a stable rim, tapered into remaining meniscus.  ACL intact.  Instruments and fluids were removed.  Portals were closed with nylon.  Knee injected with Depo-Medrol and Marcaine.  Sterile compressive dressing applied. Anesthesia reversed.   Brought to the recovery room.  Tolerated the surgery well.  No complications.     Ninetta Lights, M.D.   ______________________________ Ninetta Lights, M.D.    DFM/MEDQ  D:  10/17/2016  T:  10/17/2016  Job:  [id number]

## 2016-10-25 DIAGNOSIS — S83241D Other tear of medial meniscus, current injury, right knee, subsequent encounter: Secondary | ICD-10-CM | POA: Diagnosis not present

## 2016-11-22 DIAGNOSIS — Z008 Encounter for other general examination: Secondary | ICD-10-CM | POA: Diagnosis not present

## 2016-11-28 DIAGNOSIS — H524 Presbyopia: Secondary | ICD-10-CM | POA: Diagnosis not present

## 2016-12-05 DIAGNOSIS — S83241D Other tear of medial meniscus, current injury, right knee, subsequent encounter: Secondary | ICD-10-CM | POA: Diagnosis not present

## 2016-12-16 DIAGNOSIS — M1710 Unilateral primary osteoarthritis, unspecified knee: Secondary | ICD-10-CM | POA: Diagnosis not present

## 2016-12-16 DIAGNOSIS — E784 Other hyperlipidemia: Secondary | ICD-10-CM | POA: Diagnosis not present

## 2016-12-16 DIAGNOSIS — Z125 Encounter for screening for malignant neoplasm of prostate: Secondary | ICD-10-CM | POA: Diagnosis not present

## 2016-12-16 DIAGNOSIS — I10 Essential (primary) hypertension: Secondary | ICD-10-CM | POA: Diagnosis not present

## 2016-12-16 DIAGNOSIS — K219 Gastro-esophageal reflux disease without esophagitis: Secondary | ICD-10-CM | POA: Diagnosis not present

## 2016-12-23 DIAGNOSIS — K219 Gastro-esophageal reflux disease without esophagitis: Secondary | ICD-10-CM | POA: Diagnosis not present

## 2016-12-23 DIAGNOSIS — N400 Enlarged prostate without lower urinary tract symptoms: Secondary | ICD-10-CM | POA: Diagnosis not present

## 2016-12-23 DIAGNOSIS — Z0000 Encounter for general adult medical examination without abnormal findings: Secondary | ICD-10-CM | POA: Diagnosis not present

## 2016-12-23 DIAGNOSIS — I10 Essential (primary) hypertension: Secondary | ICD-10-CM | POA: Diagnosis not present

## 2017-06-02 DIAGNOSIS — L570 Actinic keratosis: Secondary | ICD-10-CM | POA: Diagnosis not present

## 2017-06-02 DIAGNOSIS — L812 Freckles: Secondary | ICD-10-CM | POA: Diagnosis not present

## 2017-06-02 DIAGNOSIS — B353 Tinea pedis: Secondary | ICD-10-CM | POA: Diagnosis not present

## 2017-06-02 DIAGNOSIS — Z1283 Encounter for screening for malignant neoplasm of skin: Secondary | ICD-10-CM | POA: Diagnosis not present

## 2017-06-02 DIAGNOSIS — L821 Other seborrheic keratosis: Secondary | ICD-10-CM | POA: Diagnosis not present

## 2017-06-02 DIAGNOSIS — B351 Tinea unguium: Secondary | ICD-10-CM | POA: Diagnosis not present

## 2017-06-02 DIAGNOSIS — D1800 Hemangioma unspecified site: Secondary | ICD-10-CM | POA: Diagnosis not present

## 2017-06-02 DIAGNOSIS — L578 Other skin changes due to chronic exposure to nonionizing radiation: Secondary | ICD-10-CM | POA: Diagnosis not present

## 2017-06-12 DIAGNOSIS — E7849 Other hyperlipidemia: Secondary | ICD-10-CM | POA: Diagnosis not present

## 2017-12-17 DIAGNOSIS — Z125 Encounter for screening for malignant neoplasm of prostate: Secondary | ICD-10-CM | POA: Diagnosis not present

## 2017-12-17 DIAGNOSIS — N400 Enlarged prostate without lower urinary tract symptoms: Secondary | ICD-10-CM | POA: Diagnosis not present

## 2017-12-17 DIAGNOSIS — M1710 Unilateral primary osteoarthritis, unspecified knee: Secondary | ICD-10-CM | POA: Diagnosis not present

## 2017-12-17 DIAGNOSIS — I10 Essential (primary) hypertension: Secondary | ICD-10-CM | POA: Diagnosis not present

## 2017-12-17 DIAGNOSIS — E7849 Other hyperlipidemia: Secondary | ICD-10-CM | POA: Diagnosis not present

## 2017-12-24 DIAGNOSIS — I10 Essential (primary) hypertension: Secondary | ICD-10-CM | POA: Diagnosis not present

## 2017-12-24 DIAGNOSIS — E7849 Other hyperlipidemia: Secondary | ICD-10-CM | POA: Diagnosis not present

## 2017-12-24 DIAGNOSIS — Z0000 Encounter for general adult medical examination without abnormal findings: Secondary | ICD-10-CM | POA: Diagnosis not present

## 2017-12-24 DIAGNOSIS — M1710 Unilateral primary osteoarthritis, unspecified knee: Secondary | ICD-10-CM | POA: Diagnosis not present

## 2018-01-12 DIAGNOSIS — Z86010 Personal history of colonic polyps: Secondary | ICD-10-CM | POA: Diagnosis not present

## 2018-01-22 DIAGNOSIS — H524 Presbyopia: Secondary | ICD-10-CM | POA: Diagnosis not present

## 2018-02-04 DIAGNOSIS — E785 Hyperlipidemia, unspecified: Secondary | ICD-10-CM | POA: Diagnosis not present

## 2018-02-04 DIAGNOSIS — K5730 Diverticulosis of large intestine without perforation or abscess without bleeding: Secondary | ICD-10-CM | POA: Diagnosis not present

## 2018-02-04 DIAGNOSIS — I10 Essential (primary) hypertension: Secondary | ICD-10-CM | POA: Diagnosis not present

## 2018-02-04 DIAGNOSIS — D121 Benign neoplasm of appendix: Secondary | ICD-10-CM | POA: Diagnosis not present

## 2018-02-04 DIAGNOSIS — Z1211 Encounter for screening for malignant neoplasm of colon: Secondary | ICD-10-CM | POA: Diagnosis not present

## 2018-02-04 DIAGNOSIS — K635 Polyp of colon: Secondary | ICD-10-CM | POA: Diagnosis not present

## 2018-02-04 DIAGNOSIS — D123 Benign neoplasm of transverse colon: Secondary | ICD-10-CM | POA: Diagnosis not present

## 2018-02-04 DIAGNOSIS — K640 First degree hemorrhoids: Secondary | ICD-10-CM | POA: Diagnosis not present

## 2018-02-04 DIAGNOSIS — K621 Rectal polyp: Secondary | ICD-10-CM | POA: Diagnosis not present

## 2018-02-04 DIAGNOSIS — Z86010 Personal history of colonic polyps: Secondary | ICD-10-CM | POA: Diagnosis not present

## 2018-04-01 DIAGNOSIS — M79671 Pain in right foot: Secondary | ICD-10-CM | POA: Diagnosis not present

## 2018-06-03 DIAGNOSIS — D1800 Hemangioma unspecified site: Secondary | ICD-10-CM | POA: Diagnosis not present

## 2018-06-03 DIAGNOSIS — L578 Other skin changes due to chronic exposure to nonionizing radiation: Secondary | ICD-10-CM | POA: Diagnosis not present

## 2018-06-03 DIAGNOSIS — Z1283 Encounter for screening for malignant neoplasm of skin: Secondary | ICD-10-CM | POA: Diagnosis not present

## 2018-07-02 DIAGNOSIS — B351 Tinea unguium: Secondary | ICD-10-CM | POA: Diagnosis not present

## 2018-07-02 DIAGNOSIS — B353 Tinea pedis: Secondary | ICD-10-CM | POA: Diagnosis not present

## 2018-07-13 DIAGNOSIS — B359 Dermatophytosis, unspecified: Secondary | ICD-10-CM | POA: Diagnosis not present

## 2018-07-13 DIAGNOSIS — Z79899 Other long term (current) drug therapy: Secondary | ICD-10-CM | POA: Diagnosis not present

## 2018-07-29 MED FILL — MELOXICAM 15 MG TABLET: 15 | 30 days supply | Qty: 30 | Fill #0

## 2018-08-15 MED FILL — TERBINAFINE HCL 250 MG TAB: 250 | 30 days supply | Qty: 30 | Fill #0

## 2018-08-22 MED FILL — MELOXICAM 15 MG TABLET: 15 | 30 days supply | Qty: 30 | Fill #1

## 2019-08-02 ENCOUNTER — Ambulatory Visit: Payer: Self-pay | Attending: Internal Medicine

## 2019-08-02 DIAGNOSIS — Z23 Encounter for immunization: Secondary | ICD-10-CM

## 2019-08-02 NOTE — Progress Notes (Signed)
   Covid-19 Vaccination Clinic  Name:  Dalbert Diponio    MRN: [id number] DOB: October 03, 1955  08/02/2019  Mr. Fearnow was observed post Covid-19 immunization for 15 minutes without incident. He was provided with Vaccine Information Sheet and instruction to access the V-Safe system.   Mr. Denis was instructed to call 911 with any severe reactions post vaccine: Marland Kitchen Difficulty breathing  . Swelling of face and throat  . A fast heartbeat  . A bad rash all over body  . Dizziness and weakness   Immunizations Administered    Name Date Dose VIS Date Route   Pfizer COVID-19 Vaccine 08/02/2019 10:39 AM 0.3 mL 04/09/2019 Intramuscular   Manufacturer: [place]   Lot: [id number]   [place]: [id number]

## 2019-08-24 ENCOUNTER — Ambulatory Visit: Payer: Self-pay | Attending: Internal Medicine

## 2019-08-24 DIAGNOSIS — Z23 Encounter for immunization: Secondary | ICD-10-CM

## 2019-08-24 NOTE — Progress Notes (Signed)
   Covid-19 Vaccination Clinic  Name:  Nathaniel Green    MRN: [id number] DOB: 1955-12-15  08/24/2019  Nathaniel Green was observed post Covid-19 immunization for 15 minutes without incident. He was provided with Vaccine Information Sheet and instruction to access the V-Safe system.   Nathaniel Green was instructed to call 911 with any severe reactions post vaccine: Marland Kitchen Difficulty breathing  . Swelling of face and throat  . A fast heartbeat  . A bad rash all over body  . Dizziness and weakness   Immunizations Administered    Name Date Dose VIS Date Route   Pfizer COVID-19 Vaccine 08/24/2019  1:06 PM 0.3 mL 06/23/2018 Intramuscular   Manufacturer: [place]   Lot: [id number]   [place]: [id number]

## 2021-01-03 ENCOUNTER — Other Ambulatory Visit (HOSPITAL_COMMUNITY): Payer: Self-pay | Admitting: Internal Medicine

## 2021-01-22 ENCOUNTER — Ambulatory Visit
Admission: RE | Admit: 2021-01-22 | Discharge: 2021-01-22 | Disposition: A | Discharge status: Home / Self Care | Payer: Self-pay | Source: Ambulatory Visit | Attending: Internal Medicine | Admitting: Internal Medicine

## 2021-01-22 ENCOUNTER — Other Ambulatory Visit: Payer: Self-pay

## 2021-01-22 DIAGNOSIS — R911 Solitary pulmonary nodule: Secondary | ICD-10-CM | POA: Insufficient documentation

## 2021-01-22 DIAGNOSIS — I10 Essential (primary) hypertension: Secondary | ICD-10-CM | POA: Insufficient documentation

## 2021-01-22 DIAGNOSIS — I700 Atherosclerosis of aorta: Secondary | ICD-10-CM | POA: Insufficient documentation

## 2021-01-22 DIAGNOSIS — E7849 Other hyperlipidemia: Secondary | ICD-10-CM | POA: Insufficient documentation

## 2021-01-22 DIAGNOSIS — Z136 Encounter for screening for cardiovascular disorders: Secondary | ICD-10-CM | POA: Insufficient documentation

## 2021-01-29 DIAGNOSIS — R931 Abnormal findings on diagnostic imaging of heart and coronary circulation: Secondary | ICD-10-CM | POA: Insufficient documentation

## 2021-01-29 DIAGNOSIS — I700 Atherosclerosis of aorta: Secondary | ICD-10-CM | POA: Insufficient documentation

## 2022-07-09 DIAGNOSIS — R7303 Prediabetes: Secondary | ICD-10-CM | POA: Insufficient documentation

## 2023-06-12 ENCOUNTER — Encounter: Payer: Self-pay | Admitting: Gastroenterology

## 2023-06-23 ENCOUNTER — Ambulatory Visit: Payer: Medicare HMO | Admitting: Anesthesiology

## 2023-06-23 ENCOUNTER — Encounter: Payer: Self-pay | Admitting: Gastroenterology

## 2023-06-23 ENCOUNTER — Other Ambulatory Visit: Payer: Self-pay | Admitting: Gastroenterology

## 2023-06-23 ENCOUNTER — Encounter
Admission: RE | Disposition: A | Discharge status: Home / Self Care | Payer: Self-pay | Source: Home / Self Care | Attending: Gastroenterology

## 2023-06-23 ENCOUNTER — Ambulatory Visit
Admission: RE | Admit: 2023-06-23 | Discharge: 2023-06-23 | Disposition: A | Discharge status: Home / Self Care | Payer: Medicare HMO | Attending: Gastroenterology | Admitting: Gastroenterology

## 2023-06-23 DIAGNOSIS — K640 First degree hemorrhoids: Secondary | ICD-10-CM | POA: Insufficient documentation

## 2023-06-23 DIAGNOSIS — F32A Depression, unspecified: Secondary | ICD-10-CM | POA: Diagnosis not present

## 2023-06-23 DIAGNOSIS — I10 Essential (primary) hypertension: Secondary | ICD-10-CM | POA: Insufficient documentation

## 2023-06-23 DIAGNOSIS — D122 Benign neoplasm of ascending colon: Secondary | ICD-10-CM | POA: Insufficient documentation

## 2023-06-23 DIAGNOSIS — Z79899 Other long term (current) drug therapy: Secondary | ICD-10-CM | POA: Diagnosis not present

## 2023-06-23 DIAGNOSIS — K5730 Diverticulosis of large intestine without perforation or abscess without bleeding: Secondary | ICD-10-CM | POA: Insufficient documentation

## 2023-06-23 DIAGNOSIS — K219 Gastro-esophageal reflux disease without esophagitis: Secondary | ICD-10-CM | POA: Diagnosis not present

## 2023-06-23 DIAGNOSIS — D125 Benign neoplasm of sigmoid colon: Secondary | ICD-10-CM | POA: Insufficient documentation

## 2023-06-23 DIAGNOSIS — Z83719 Family history of colon polyps, unspecified: Secondary | ICD-10-CM | POA: Diagnosis not present

## 2023-06-23 DIAGNOSIS — D124 Benign neoplasm of descending colon: Secondary | ICD-10-CM | POA: Diagnosis not present

## 2023-06-23 DIAGNOSIS — Z1211 Encounter for screening for malignant neoplasm of colon: Secondary | ICD-10-CM | POA: Insufficient documentation

## 2023-06-23 HISTORY — DX: Benign prostatic hyperplasia without lower urinary tract symptoms: N40.0

## 2023-06-23 HISTORY — DX: Gastro-esophageal reflux disease without esophagitis: K21.9

## 2023-06-23 HISTORY — DX: Unspecified osteoarthritis, unspecified site: M19.90

## 2023-06-23 HISTORY — DX: Atherosclerosis of aorta: I70.0

## 2023-06-23 HISTORY — DX: Hyperlipidemia, unspecified: E78.5

## 2023-06-23 HISTORY — DX: Prediabetes: R73.03

## 2023-06-23 HISTORY — PX: POLYPECTOMY: SHX5525

## 2023-06-23 HISTORY — PX: COLONOSCOPY WITH PROPOFOL: SHX5780

## 2023-06-23 SURGERY — COLONOSCOPY WITH PROPOFOL
Anesthesia: General

## 2023-06-23 MED ORDER — STERILE WATER FOR IRRIGATION IR SOLN
Status: DC | PRN
  Administered 2023-06-23: 60 mL

## 2023-06-23 MED ORDER — PROPOFOL 10 MG/ML IV BOLUS
INTRAVENOUS | Status: DC | PRN
  Administered 2023-06-23: 100 mg via INTRAVENOUS

## 2023-06-23 MED ORDER — PROPOFOL 500 MG/50ML IV EMUL
INTRAVENOUS | Status: DC | PRN
  Administered 2023-06-23: 150 ug/kg/min via INTRAVENOUS

## 2023-06-23 MED ORDER — SODIUM CHLORIDE 0.9 % IV SOLN
INTRAVENOUS | Status: DC
  Administered 2023-06-23: 20 mL/h via INTRAVENOUS

## 2023-06-23 MED ORDER — EPHEDRINE 5 MG/ML INJ
INTRAVENOUS | Status: AC
  Filled 2023-06-23: qty 5

## 2023-06-23 MED ORDER — PROPOFOL 1000 MG/100ML IV EMUL
INTRAVENOUS | Status: AC
  Filled 2023-06-23: qty 100

## 2023-06-23 NOTE — Anesthesia Postprocedure Evaluation (Signed)
 Anesthesia Post Note  Patient: Nathaniel Green  Procedure(s) Performed: COLONOSCOPY WITH PROPOFOL POLYPECTOMY  Patient location during evaluation: PACU Anesthesia Type: General Level of consciousness: awake and alert Pain management: pain level controlled Vital Signs Assessment: post-procedure vital signs reviewed and stable Respiratory status: spontaneous breathing, nonlabored ventilation, respiratory function stable and patient connected to nasal cannula oxygen Cardiovascular status: blood pressure returned to baseline and stable Postop Assessment: no apparent nausea or vomiting Anesthetic complications: no   There were no known notable events for this encounter.   Last Vitals:  Vitals:   06/23/23 0815 06/23/23 0825  BP: 116/74 105/81  Pulse: 72 72  Resp: 18 18  Temp:    SpO2: 98% 99%    Last Pain:  Vitals:   06/23/23 0825  TempSrc:   PainSc: 0-No pain                 Yevette Edwards

## 2023-06-23 NOTE — Anesthesia Preprocedure Evaluation (Signed)
 Anesthesia Evaluation  Patient identified by MRN, date of birth, ID band Patient awake    Reviewed: Allergy & Precautions, H&P , NPO status , Patient's Chart, lab work & pertinent test results, reviewed documented beta blocker date and time   Airway Mallampati: II   Neck ROM: full    Dental  (+) Poor Dentition   Pulmonary neg pulmonary ROS   Pulmonary exam normal        Cardiovascular Exercise Tolerance: Good hypertension, On Medications negative cardio ROS Normal cardiovascular exam Rhythm:regular Rate:Normal     Neuro/Psych  PSYCHIATRIC DISORDERS  Depression    negative neurological ROS     GI/Hepatic Neg liver ROS,GERD  Medicated,,  Endo/Other  negative endocrine ROS    Renal/GU negative Renal ROS  negative genitourinary   Musculoskeletal   Abdominal   Peds  Hematology negative hematology ROS (+)   Anesthesia Other Findings Past Medical History: No date: Aortic atherosclerosis (HCC) No date: Benign prostatic hyperplasia No date: Degenerative joint disease No date: Depression No date: Elevated cholesterol No date: GERD (gastroesophageal reflux disease) No date: Hyperlipidemia No date: Hypertension No date: Mild acid reflux No date: Pre-diabetes Past Surgical History: No date: APPENDECTOMY 10/17/2016: CHONDROPLASTY     Comment:  Procedure: CHONDROPLASTY;  Surgeon: Loreta Ave,               MD;  Location: [facility];  Service:               Orthopedics;; No date: COLONOSCOPY No date: ESOPHAGOGASTRODUODENOSCOPY; N/A No date: KNEE ARTHROSCOPY 10/17/2016: KNEE ARTHROSCOPY WITH LATERAL MENISECTOMY; Right     Comment:  Procedure: KNEE ARTHROSCOPY WITH LATERAL MENISECTOMY;                Surgeon: Loreta Ave, MD;  Location: [facility];  Service: Orthopedics;  Laterality:               Right; 10/17/2016: KNEE ARTHROSCOPY WITH MEDIAL MENISECTOMY; Right      Comment:  Procedure: RIGHT KNEE ARTHROSCOPY WITH MEDIAL AND               LATERAL MENISECTOMY, CHONDROPLASTY;  Surgeon: Loreta Ave, MD;  Location:  SURGERY CENTER;                Service: Orthopedics;  Laterality: Right; No date: TONSILLECTOMY BMI    Body Mass Index: 33.32 kg/m     Reproductive/Obstetrics negative OB ROS                             Anesthesia Physical Anesthesia Plan  ASA: 3  Anesthesia Plan: General   Post-op Pain Management:    Induction:   PONV Risk Score and Plan:   Airway Management Planned:   Additional Equipment:   Intra-op Plan:   Post-operative Plan:   Informed Consent: I have reviewed the patients History and Physical, chart, labs and discussed the procedure including the risks, benefits and alternatives for the proposed anesthesia with the patient or authorized representative who has indicated his/her understanding and acceptance.     Dental Advisory Given  Plan Discussed with: CRNA  Anesthesia Plan Comments:        Anesthesia Quick Evaluation

## 2023-06-23 NOTE — Interval H&P Note (Signed)
 History and Physical Interval Note: Preprocedure H&P from 06/23/23  was reviewed and there was no interval change after seeing and examining the patient.  Written consent was obtained from the patient after discussion of risks, benefits, and alternatives. Patient has consented to proceed with Colonoscopy with possible intervention    06/23/2023 7:32 AM  Nathaniel Green  has presented today for surgery, with the diagnosis of Z86.0101 (ICD-10-CM) - Personal history of adenomatous and serrated colon polyps.  The various methods of treatment have been discussed with the patient and family. After consideration of risks, benefits and other options for treatment, the patient has consented to  Procedure(s): COLONOSCOPY WITH PROPOFOL (N/A) as a surgical intervention.  The patient's history has been reviewed, patient examined, no change in status, stable for surgery.  I have reviewed the patient's chart and labs.  Questions were answered to the patient's satisfaction.     Jaynie Collins

## 2023-06-23 NOTE — Transfer of Care (Signed)
 Immediate Anesthesia Transfer of Care Note  Patient: Nathaniel Green  Procedure(s) Performed: COLONOSCOPY WITH PROPOFOL POLYPECTOMY  Patient Location: PACU  Anesthesia Type:General  Level of Consciousness: awake and sedated  Airway & Oxygen Therapy: Patient Spontanous Breathing and Patient connected to nasal cannula oxygen  Post-op Assessment: Report given to RN and Post -op Vital signs reviewed and stable  Post vital signs: Reviewed and stable  Last Vitals:  Vitals Value Taken Time  BP    Temp    Pulse    Resp    SpO2      Last Pain:  Vitals:   06/23/23 0652  TempSrc: Temporal  PainSc: 0-No pain         Complications: There were no known notable events for this encounter.

## 2023-06-23 NOTE — H&P (Signed)
 Pre-Procedure H&P   Patient ID: Nathaniel Green is a 68 y.o. male.  Gastroenterology Provider: Jaynie Collins, DO  Referring Provider: Dr. Graciela Husbands PCP: Lynnea Ferrier, MD  Date: 06/23/2023  HPI Nathaniel Green is a 68 y.o. male who presents today for Colonoscopy for Personal and family history of colon polyps .  Bowel movements are regular without melena or hematochezia  Last colonoscopy was October 2019 with 1 sessile serrated polyp and adenomatous polyp  Previous colonoscopies in 2014, 2011 and 2007 all demonstrated polyps  Hemoglobin 15.6 MCV 91.4 platelets 211,000 creatinine 1.1   Past Medical History:  Diagnosis Date   Aortic atherosclerosis (HCC)    Benign prostatic hyperplasia    Degenerative joint disease    Depression    Elevated cholesterol    GERD (gastroesophageal reflux disease)    Hyperlipidemia    Hypertension    Mild acid reflux    Pre-diabetes     Past Surgical History:  Procedure Laterality Date   APPENDECTOMY     CHONDROPLASTY  10/17/2016   Procedure: CHONDROPLASTY;  Surgeon: Loreta Ave, MD;  Location: [facility];  Service: Orthopedics;;   COLONOSCOPY     ESOPHAGOGASTRODUODENOSCOPY N/A    KNEE ARTHROSCOPY     KNEE ARTHROSCOPY WITH LATERAL MENISECTOMY Right 10/17/2016   Procedure: KNEE ARTHROSCOPY WITH LATERAL MENISECTOMY;  Surgeon: Loreta Ave, MD;  Location: [facility];  Service: Orthopedics;  Laterality: Right;   KNEE ARTHROSCOPY WITH MEDIAL MENISECTOMY Right 10/17/2016   Procedure: RIGHT KNEE ARTHROSCOPY WITH MEDIAL AND LATERAL MENISECTOMY, CHONDROPLASTY;  Surgeon: Loreta Ave, MD;  Location: [facility];  Service: Orthopedics;  Laterality: Right;   TONSILLECTOMY      Family History - mother- colon polyps No other h/o GI disease or malignancy  Review of Systems  Constitutional:  Negative for activity change, appetite change, chills, diaphoresis, fatigue, fever and  unexpected weight change.  HENT:  Negative for trouble swallowing and voice change.   Respiratory:  Negative for shortness of breath and wheezing.   Cardiovascular:  Negative for chest pain, palpitations and leg swelling.  Gastrointestinal:  Negative for abdominal distention, abdominal pain, anal bleeding, blood in stool, constipation, diarrhea, nausea and vomiting.  Musculoskeletal:  Negative for arthralgias and myalgias.  Skin:  Negative for color change and pallor.  Neurological:  Negative for dizziness, syncope and weakness.  Psychiatric/Behavioral:  Negative for confusion. The patient is not nervous/anxious.   All other systems reviewed and are negative.    Medications No current facility-administered medications on file prior to encounter.   Current Outpatient Medications on File Prior to Encounter  Medication Sig Dispense Refill   cholecalciferol (VITAMIN D) 1000 units tablet Take 1,000 Units by mouth daily.     co-enzyme Q-10 30 MG capsule Take 30 mg by mouth 3 (three) times daily.     lisinopril (PRINIVIL,ZESTRIL) 20 MG tablet Take 20 mg by mouth daily.     Multiple Vitamins-Minerals (MULTIVITAMIN WITH MINERALS) tablet Take 1 tablet by mouth daily.     Omega-3 Fatty Acids (FISH OIL OMEGA-3) 1000 MG CAPS Take by mouth.     ondansetron (ZOFRAN) 4 MG tablet Take 1 tablet (4 mg total) by mouth every 8 (eight) hours as needed for nausea or vomiting. 40 tablet 0   oxyCODONE-acetaminophen (ROXICET) 5-325 MG tablet Take 1-2 tablets by mouth every 4 (four) hours as needed. 60 tablet 0    Pertinent medications related to GI and procedure  were reviewed by me with the patient prior to the procedure   Current Facility-Administered Medications:    0.9 %  sodium chloride infusion, , Intravenous, Continuous, Shed, Nixon, DO, Last Rate: 20 mL/hr at 06/23/23 1610, Continued from Pre-op at 06/23/23 9604  sodium chloride 20 mL/hr at 06/23/23 5409       No Known Allergies Allergies  were reviewed by me prior to the procedure  Objective   Body mass index is 33.32 kg/m. Vitals:   06/23/23 0652  BP: 127/83  Pulse: 69  Resp: 20  Temp: (!) 96.9 F (36.1 C)  TempSrc: Temporal  SpO2: 97%  Weight: 105.3 kg  Height: 5\' 10"  (1.778 m)     Physical Exam Vitals and nursing note reviewed.  Constitutional:      General: He is not in acute distress.    Appearance: Normal appearance. He is not ill-appearing, toxic-appearing or diaphoretic.  HENT:     Head: Normocephalic and atraumatic.     Nose: Nose normal.     Mouth/Throat:     Mouth: Mucous membranes are moist.     Pharynx: Oropharynx is clear.  Eyes:     General: No scleral icterus.    Extraocular Movements: Extraocular movements intact.  Cardiovascular:     Rate and Rhythm: Normal rate and regular rhythm.     Heart sounds: Normal heart sounds. No murmur heard.    No friction rub. No gallop.  Pulmonary:     Effort: Pulmonary effort is normal. No respiratory distress.     Breath sounds: Normal breath sounds. No wheezing, rhonchi or rales.  Abdominal:     General: Bowel sounds are normal. There is no distension.     Palpations: Abdomen is soft.     Tenderness: There is no abdominal tenderness. There is no guarding or rebound.  Musculoskeletal:     Cervical back: Neck supple.     Right lower leg: No edema.     Left lower leg: No edema.  Skin:    General: Skin is warm and dry.     Coloration: Skin is not jaundiced or pale.  Neurological:     General: No focal deficit present.     Mental Status: He is alert and oriented to person, place, and time. Mental status is at baseline.  Psychiatric:        Mood and Affect: Mood normal.        Behavior: Behavior normal.        Thought Content: Thought content normal.        Judgment: Judgment normal.      Assessment:  Mr. Nathaniel Green is a 68 y.o. male  who presents today for Colonoscopy for Personal and family history of colon polyps .  Plan:   Colonoscopy with possible intervention today  Colonoscopy with possible biopsy, control of bleeding, polypectomy, and interventions as necessary has been discussed with the patient/patient representative. Informed consent was obtained from the patient/patient representative after explaining the indication, nature, and risks of the procedure including but not limited to death, bleeding, perforation, missed neoplasm/lesions, cardiorespiratory compromise, and reaction to medications. Opportunity for questions was given and appropriate answers were provided. Patient/patient representative has verbalized understanding is amenable to undergoing the procedure.   Jaynie Collins, DO  [facility]  Portions of the record may have been created with voice recognition software. Occasional wrong-word or 'sound-a-like' substitutions may have occurred due to the inherent limitations of voice recognition software.  Read the  chart carefully and recognize, using context, where substitutions may have occurred.

## 2023-06-23 NOTE — Op Note (Signed)
 [facility] Patient Name: Nathaniel Green Procedure Date: 06/23/2023 7:10 AM MRN: [id number] Account #: [id number] Date of Birth: 15-Jun-1955 Admit Type: Outpatient Age: 68 Room: [facility] ENDO ROOM 2 Gender: Male Note Status: Finalized Instrument Name: Colonscope [id number] Procedure:             Colonoscopy Indications:           High risk colon cancer surveillance: Personal history                         of colonic polyps Providers:             Trenda Moots, DO Referring MD:          Daniel Nones, MD (Referring MD) Medicines:             Monitored Anesthesia Care Complications:         No immediate complications. Estimated blood loss:                         Minimal. Procedure:             Pre-Anesthesia Assessment:                        - Prior to the procedure, a History and Physical was                         performed, and patient medications and allergies were                         reviewed. The patient is competent. The risks and                         benefits of the procedure and the sedation options and                         risks were discussed with the patient. All questions                         were answered and informed consent was obtained.                         Patient identification and proposed procedure were                         verified by the physician, the nurse, the anesthetist                         and the technician in the endoscopy suite. Mental                         Status Examination: alert and oriented. Airway                         Examination: normal oropharyngeal airway and neck                         mobility. Respiratory Examination: clear to  auscultation. CV Examination: RRR, no murmurs, no S3                         or S4. Prophylactic Antibiotics: The patient does not                         require prophylactic antibiotics. Prior                          Anticoagulants: The patient has taken no anticoagulant                         or antiplatelet agents. ASA Grade Assessment: III - A                         patient with severe systemic disease. After reviewing                         the risks and benefits, the patient was deemed in                         satisfactory condition to undergo the procedure. The                         anesthesia plan was to use monitored anesthesia care                         (MAC). Immediately prior to administration of                         medications, the patient was re-assessed for adequacy                         to receive sedatives. The heart rate, respiratory                         rate, oxygen saturations, blood pressure, adequacy of                         pulmonary ventilation, and response to care were                         monitored throughout the procedure. The physical                         status of the patient was re-assessed after the                         procedure.                        After obtaining informed consent, the colonoscope was                         passed under direct vision. Throughout the procedure,                         the patient's blood pressure, pulse, and oxygen  saturations were monitored continuously. The                         Colonoscope was introduced through the anus and                         advanced to the the terminal ileum, with                         identification of the appendiceal orifice and IC                         valve. The colonoscopy was performed without                         difficulty. The patient tolerated the procedure well.                         The quality of the bowel preparation was evaluated                         using the BBPS [facility] Bowel Preparation Scale) with                         scores of: Right Colon = 3, Transverse Colon = 3 and                         Left Colon = 3 (entire  mucosa seen well with no                         residual staining, small fragments of stool or opaque                         liquid). The total BBPS score equals 9. The terminal                         ileum, ileocecal valve, appendiceal orifice, and                         rectum were photographed. Findings:      The perianal and digital rectal examinations were normal. Pertinent       negatives include normal sphincter tone.      The terminal ileum appeared normal. Estimated blood loss: none.      Seven sessile polyps were found in the sigmoid colon (1), descending       colon (3) and ascending colon (3). The polyps were 1 to 2 mm in size.       These polyps were removed with a jumbo cold forceps. Resection and       retrieval were complete. Estimated blood loss was minimal.      Multiple small-mouthed diverticula were found in the sigmoid colon.       Estimated blood loss: none.      Non-bleeding internal hemorrhoids were found during retroflexion. The       hemorrhoids were Grade I (internal hemorrhoids that do not prolapse).       Estimated blood loss: none.      The exam was otherwise without abnormality on direct and  retroflexion       views. Impression:            - The examined portion of the ileum was normal.                        - Seven 1 to 2 mm polyps in the sigmoid colon, in the                         descending colon and in the ascending colon, removed                         with a jumbo cold forceps. Resected and retrieved.                        - Diverticulosis in the sigmoid colon.                        - Non-bleeding internal hemorrhoids.                        - The examination was otherwise normal on direct and                         retroflexion views. Recommendation:        - Patient has a contact number available for                         emergencies. The signs and symptoms of potential                         delayed complications were discussed with  the patient.                         Return to normal activities tomorrow. Written                         discharge instructions were provided to the patient.                        - Discharge patient to home.                        - Resume previous diet.                        - Continue present medications.                        - Await pathology results.                        - Repeat colonoscopy for surveillance based on                         pathology results.                        - Return to referring physician as previously  scheduled.                        - The findings and recommendations were discussed with                         the patient. Procedure Code(s):     --- Professional ---                        [phone], Colonoscopy, flexible; with biopsy, single or                         multiple Diagnosis Code(s):     --- Professional ---                        Z86.010, Personal history of colonic polyps                        K64.0, First degree hemorrhoids                        D12.5, Benign neoplasm of sigmoid colon                        D12.4, Benign neoplasm of descending colon                        D12.2, Benign neoplasm of ascending colon                        K57.30, Diverticulosis of large intestine without                         perforation or abscess without bleeding CPT copyright 2022 American Medical Association. All rights reserved. The codes documented in this report are preliminary and upon coder review may  be revised to meet current compliance requirements. Attending Participation:      I personally performed the entire procedure. Elfredia Nevins, DO Jaynie Collins DO, DO 06/23/2023 8:10:18 AM This report has been signed electronically. Number of Addenda: 0 Note Initiated On: 06/23/2023 7:10 AM Scope Withdrawal Time: 0 hours 15 minutes 32 seconds  Total Procedure Duration: 0 hours 24 minutes 39 seconds  Estimated  Blood Loss:  Estimated blood loss was minimal.      [facility]

## 2023-06-24 ENCOUNTER — Encounter: Payer: Self-pay | Admitting: Gastroenterology

## 2023-06-24 LAB — SURGICAL PATHOLOGY

## 2024-02-10 ENCOUNTER — Ambulatory Visit: Admitting: Dermatology

## 2024-02-10 ENCOUNTER — Encounter: Payer: Self-pay | Admitting: Dermatology

## 2024-02-10 DIAGNOSIS — L821 Other seborrheic keratosis: Secondary | ICD-10-CM

## 2024-02-10 DIAGNOSIS — L578 Other skin changes due to chronic exposure to nonionizing radiation: Secondary | ICD-10-CM

## 2024-02-10 DIAGNOSIS — Z1283 Encounter for screening for malignant neoplasm of skin: Secondary | ICD-10-CM | POA: Diagnosis not present

## 2024-02-10 DIAGNOSIS — W908XXA Exposure to other nonionizing radiation, initial encounter: Secondary | ICD-10-CM

## 2024-02-10 DIAGNOSIS — M1990 Unspecified osteoarthritis, unspecified site: Secondary | ICD-10-CM | POA: Insufficient documentation

## 2024-02-10 DIAGNOSIS — K219 Gastro-esophageal reflux disease without esophagitis: Secondary | ICD-10-CM | POA: Insufficient documentation

## 2024-02-10 DIAGNOSIS — D1801 Hemangioma of skin and subcutaneous tissue: Secondary | ICD-10-CM | POA: Diagnosis not present

## 2024-02-10 DIAGNOSIS — L570 Actinic keratosis: Secondary | ICD-10-CM

## 2024-02-10 DIAGNOSIS — E785 Hyperlipidemia, unspecified: Secondary | ICD-10-CM | POA: Insufficient documentation

## 2024-02-10 DIAGNOSIS — N400 Enlarged prostate without lower urinary tract symptoms: Secondary | ICD-10-CM | POA: Insufficient documentation

## 2024-02-10 DIAGNOSIS — L814 Other melanin hyperpigmentation: Secondary | ICD-10-CM | POA: Diagnosis not present

## 2024-02-10 DIAGNOSIS — D229 Melanocytic nevi, unspecified: Secondary | ICD-10-CM

## 2024-02-10 DIAGNOSIS — I10 Essential (primary) hypertension: Secondary | ICD-10-CM | POA: Insufficient documentation

## 2024-02-10 MED ORDER — FLUOROURACIL 5 % EX CREA: TOPICAL_CREAM | Freq: Two times a day (BID) | CUTANEOUS | 0 refills | Status: AC

## 2024-02-10 NOTE — Progress Notes (Signed)
 New Patient Visit   Subjective  Nathaniel Green is a 68 y.o. male who presents for the following: Skin Cancer Screening and Full Body Skin Exam  The patient presents for Total-Body Skin Exam (TBSE) for skin cancer screening and mole check. The patient has spots, moles and lesions to be evaluated, some may be new or changing and the patient may have concern these could be cancer.  No hx of skin cancer. A spot at left ear.  The following portions of the chart were reviewed this encounter and updated as appropriate: medications, allergies, medical history  Review of Systems:  No other skin or systemic complaints except as noted in HPI or Assessment and Plan.  Objective  Well appearing patient in no apparent distress; mood and affect are within normal limits.  A full examination was performed including scalp, head, eyes, ears, nose, lips, neck, chest, axillae, abdomen, back, buttocks, bilateral upper extremities, bilateral lower extremities, hands, feet, fingers, toes, fingernails, and toenails. All findings within normal limits unless otherwise noted below.   Relevant physical exam findings are noted in the Assessment and Plan.    Assessment & Plan   SKIN CANCER SCREENING PERFORMED TODAY.  ACTINIC DAMAGE WITH PRECANCEROUS ACTINIC KERATOSES Counseling for Topical Chemotherapy Management: Patient exhibits: - Severe, confluent actinic changes with pre-cancerous actinic keratoses that is secondary to cumulative UV radiation exposure over time - Condition that is severe; chronic, not at goal. - diffuse scaly erythematous macules and papules with underlying dyspigmentation - Discussed Prescription Field Treatment topical Chemotherapy for Severe, Chronic Confluent Actinic Changes with Pre-Cancerous Actinic Keratoses Field treatment involves treatment of an entire area of skin that has confluent Actinic Changes (Sun/ Ultraviolet light damage) and PreCancerous Actinic Keratoses by method of  PhotoDynamic Therapy (PDT) and/or prescription Topical Chemotherapy agents such as 5-fluorouracil, 5-fluorouracil/calcipotriene, and/or imiquimod.  The purpose is to decrease the number of clinically evident and subclinical PreCancerous lesions to prevent progression to development of skin cancer by chemically destroying early precancer changes that may or may not be visible.  It has been shown to reduce the risk of developing skin cancer in the treated area. As a result of treatment, redness, scaling, crusting, and open sores may occur during treatment course. One or more than one of these methods may be used and may have to be used several times to control, suppress and eliminate the PreCancerous changes. Discussed treatment course, expected reaction, and possible side effects. - Recommend daily broad spectrum sunscreen SPF 30+ to sun-exposed areas, reapply every 2 hours as needed.  - Staying in the shade or wearing long sleeves, sun glasses (UVA+UVB protection) and wide brim hats (4-inch brim around the entire circumference of the hat) are also recommended. - Call for new or changing lesions. - Start 5-fluorouracil cream twice a day until red and irritated to affected areas including left nasal bridge and dorsum.  Reviewed course of treatment and expected reaction.  Patient advised to expect inflammation and crusting and advised that erosions are possible.  Patient advised to be diligent with sun protection during and after treatment. Handout with details of how to apply medication and what to expect provided. Counseled to keep medication out of reach of children and pets.   LENTIGINES, SEBORRHEIC KERATOSES, HEMANGIOMAS - Benign normal skin lesions - Benign-appearing - Call for any changes  MELANOCYTIC NEVI - Tan-brown and/or pink-flesh-colored symmetric macules and papules - Benign appearing on exam today - Observation - Call clinic for new or changing moles - Recommend daily  use of broad  spectrum spf 30+ sunscreen to sun-exposed areas.    LENTIGINES   MULTIPLE BENIGN NEVI   ACTINIC KERATOSES   ACTINIC ELASTOSIS   SEBORRHEIC KERATOSES   CHERRY ANGIOMA    Return in about 3 months (around 05/12/2024) for AK follow up, with Dr. Claudene.  LILLETTE Lonell Drones, RMA, am acting as scribe for Boneta Claudene, MD .   Documentation: I have reviewed the above documentation for accuracy and completeness, and I agree with the above.  Boneta Claudene, MD

## 2024-02-10 NOTE — Patient Instructions (Addendum)
 - Start 5-fluorouracil cream twice a day until red and irritated to affected areas including nose.  Reviewed course of treatment and expected reaction.  Patient advised to expect inflammation and crusting and advised that erosions are possible.  Patient advised to be diligent with sun protection during and after treatment. Handout with details of how to apply medication and what to expect provided. Counseled to keep medication out of reach of children and pets.  Melanoma ABCDEs  Melanoma is the most dangerous type of skin cancer, and is the leading cause of death from skin disease.  You are more likely to develop melanoma if you: Have light-colored skin, light-colored eyes, or red or blond hair Spend a lot of time in the sun Tan regularly, either outdoors or in a tanning bed Have had blistering sunburns, especially during childhood Have a close family member who has had a melanoma Have atypical moles or large birthmarks  Early detection of melanoma is key since treatment is typically straightforward and cure rates are extremely high if we catch it early.   The first sign of melanoma is often a change in a mole or a new dark spot.  The ABCDE system is a way of remembering the signs of melanoma.  A for asymmetry:  The two halves do not match. B for border:  The edges of the growth are irregular. C for color:  A mixture of colors are present instead of an even brown color. D for diameter:  Melanomas are usually (but not always) greater than 6mm - the size of a pencil eraser. E for evolution:  The spot keeps changing in size, shape, and color.  Please check your skin once per month between visits. You can use a small mirror in front and a large mirror behind you to keep an eye on the back side or your body.   If you see any new or changing lesions before your next follow-up, please call to schedule a visit.  Please continue daily skin protection including broad spectrum sunscreen SPF 30+ to  sun-exposed areas, reapplying every 2 hours as needed when you're outdoors.    Due to recent changes in healthcare laws, you may see results of your pathology and/or laboratory studies on MyChart before the doctors have had a chance to review them. We understand that in some cases there may be results that are confusing or concerning to you. Please understand that not all results are received at the same time and often the doctors may need to interpret multiple results in order to provide you with the best plan of care or course of treatment. Therefore, we ask that you please give us  2 business days to thoroughly review all your results before contacting the office for clarification. Should we see a critical lab result, you will be contacted sooner.   If You Need Anything After Your Visit  If you have any questions or concerns for your doctor, please call our main line at [phone] and press option 4 to reach your doctor's medical assistant. If no one answers, please leave a voicemail as directed and we will return your call as soon as possible. Messages left after 4 pm will be answered the following business day.   You may also send us  a message via MyChart. We typically respond to MyChart messages within 1-2 business days.  For prescription refills, please ask your pharmacy to contact our office. Our fax number is [phone].  If you have an urgent issue when  the clinic is closed that cannot wait until the next business day, you can page your doctor at the number below.    Please note that while we do our best to be available for urgent issues outside of office hours, we are not available 24/7.   If you have an urgent issue and are unable to reach us , you may choose to seek medical care at your doctor's office, retail clinic, urgent care center, or emergency room.  If you have a medical emergency, please immediately call 911 or go to the emergency department.  Pager Numbers  - Dr.  Hester: [phone]  - Dr. Jackquline: [phone]  - Dr. Claudene: [phone]   - Dr. Raymund: [phone]  In the event of inclement weather, please call our main line at [phone] for an update on the status of any delays or closures.  Dermatology Medication Tips: Please keep the boxes that topical medications come in in order to help keep track of the instructions about where and how to use these. Pharmacies typically print the medication instructions only on the boxes and not directly on the medication tubes.   If your medication is too expensive, please contact our office at [phone] option 4 or send us  a message through MyChart.   We are unable to tell what your co-pay for medications will be in advance as this is different depending on your insurance coverage. However, we may be able to find a substitute medication at lower cost or fill out paperwork to get insurance to cover a needed medication.   If a prior authorization is required to get your medication covered by your insurance company, please allow us  1-2 business days to complete this process.  Drug prices often vary depending on where the prescription is filled and some pharmacies may offer cheaper prices.  The website www.goodrx.com contains coupons for medications through different pharmacies. The prices here do not account for what the cost may be with help from insurance (it may be cheaper with your insurance), but the website can give you the price if you did not use any insurance.  - You can print the associated coupon and take it with your prescription to the pharmacy.  - You may also stop by our office during regular business hours and pick up a GoodRx coupon card.  - If you need your prescription sent electronically to a different pharmacy, notify our office through [facility] or by phone at [phone] option 4.

## 2024-05-13 ENCOUNTER — Ambulatory Visit: Admitting: Dermatology

## 2024-06-17 ENCOUNTER — Ambulatory Visit: Admitting: Dermatology
